# Patient Record
Sex: Female | Born: 1970 | Race: White | Hispanic: No | Marital: Married | Smoking: Never smoker
Health system: Southern US, Community
[De-identification: ages and names within clinical notes are randomized; demographics above are authoritative.]

## PROBLEM LIST (undated history)

## (undated) DIAGNOSIS — S7221XA Displaced subtrochanteric fracture of right femur, initial encounter for closed fracture: Secondary | ICD-10-CM

## (undated) DIAGNOSIS — K219 Gastro-esophageal reflux disease without esophagitis: Secondary | ICD-10-CM

## (undated) DIAGNOSIS — M810 Age-related osteoporosis without current pathological fracture: Secondary | ICD-10-CM

## (undated) DIAGNOSIS — Z5189 Encounter for other specified aftercare: Secondary | ICD-10-CM

## (undated) DIAGNOSIS — F32A Depression, unspecified: Secondary | ICD-10-CM

## (undated) DIAGNOSIS — H409 Unspecified glaucoma: Secondary | ICD-10-CM

## (undated) DIAGNOSIS — Z8632 Personal history of gestational diabetes: Secondary | ICD-10-CM

## (undated) DIAGNOSIS — E785 Hyperlipidemia, unspecified: Secondary | ICD-10-CM

## (undated) DIAGNOSIS — M8580 Other specified disorders of bone density and structure, unspecified site: Secondary | ICD-10-CM

## (undated) DIAGNOSIS — F329 Major depressive disorder, single episode, unspecified: Secondary | ICD-10-CM

## (undated) DIAGNOSIS — Z8669 Personal history of other diseases of the nervous system and sense organs: Secondary | ICD-10-CM

## (undated) DIAGNOSIS — S72001A Fracture of unspecified part of neck of right femur, initial encounter for closed fracture: Secondary | ICD-10-CM

## (undated) HISTORY — DX: Age-related osteoporosis without current pathological fracture: M81.0

## (undated) HISTORY — DX: Gastro-esophageal reflux disease without esophagitis: K21.9

## (undated) HISTORY — DX: Other specified disorders of bone density and structure, unspecified site: M85.80

## (undated) HISTORY — DX: Hyperlipidemia, unspecified: E78.5

## (undated) HISTORY — DX: Depression, unspecified: F32.A

## (undated) HISTORY — DX: Encounter for other specified aftercare: Z51.89

## (undated) HISTORY — DX: Personal history of other diseases of the nervous system and sense organs: Z86.69

## (undated) HISTORY — DX: Personal history of gestational diabetes: Z86.32

## (undated) HISTORY — DX: Unspecified glaucoma: H40.9

## (undated) HISTORY — PX: FRACTURE SURGERY: SHX138

## (undated) HISTORY — DX: Major depressive disorder, single episode, unspecified: F32.9

---
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
## 1998-02-09 ENCOUNTER — Other Ambulatory Visit: Admission: RE | Admit: 1998-02-09 | Discharge: 1998-02-09 | Payer: Self-pay | Admitting: Gynecology

## 1999-02-16 ENCOUNTER — Other Ambulatory Visit: Admission: RE | Admit: 1999-02-16 | Discharge: 1999-02-16 | Payer: Self-pay | Admitting: Gynecology

## 2000-02-22 ENCOUNTER — Other Ambulatory Visit: Admission: RE | Admit: 2000-02-22 | Discharge: 2000-02-22 | Payer: Self-pay | Admitting: *Deleted

## 2003-04-04 ENCOUNTER — Other Ambulatory Visit: Admission: RE | Admit: 2003-04-04 | Discharge: 2003-04-04 | Payer: Self-pay | Admitting: Gynecology

## 2004-06-25 ENCOUNTER — Other Ambulatory Visit: Admission: RE | Admit: 2004-06-25 | Discharge: 2004-06-25 | Payer: Self-pay | Admitting: Gynecology

## 2005-05-16 HISTORY — PX: RHINOPLASTY: SUR1284

## 2005-06-16 ENCOUNTER — Encounter: Payer: Self-pay | Admitting: Family Medicine

## 2005-06-28 ENCOUNTER — Other Ambulatory Visit: Admission: RE | Admit: 2005-06-28 | Discharge: 2005-06-28 | Payer: Self-pay | Admitting: Gynecology

## 2006-03-01 ENCOUNTER — Ambulatory Visit: Payer: Self-pay | Admitting: Family Medicine

## 2006-07-05 ENCOUNTER — Other Ambulatory Visit: Admission: RE | Admit: 2006-07-05 | Discharge: 2006-07-05 | Payer: Self-pay | Admitting: Gynecology

## 2007-07-13 ENCOUNTER — Other Ambulatory Visit: Admission: RE | Admit: 2007-07-13 | Discharge: 2007-07-13 | Payer: Self-pay | Admitting: Gynecology

## 2007-07-20 ENCOUNTER — Encounter: Admission: RE | Admit: 2007-07-20 | Discharge: 2007-07-20 | Payer: Self-pay | Admitting: Gynecology

## 2007-12-21 ENCOUNTER — Ambulatory Visit: Payer: Self-pay | Admitting: Family Medicine

## 2007-12-21 DIAGNOSIS — H6690 Otitis media, unspecified, unspecified ear: Secondary | ICD-10-CM | POA: Insufficient documentation

## 2007-12-21 DIAGNOSIS — H8110 Benign paroxysmal vertigo, unspecified ear: Secondary | ICD-10-CM

## 2007-12-26 ENCOUNTER — Telehealth (INDEPENDENT_AMBULATORY_CARE_PROVIDER_SITE_OTHER): Payer: Self-pay | Admitting: *Deleted

## 2008-01-07 ENCOUNTER — Telehealth: Payer: Self-pay | Admitting: Family Medicine

## 2008-01-07 DIAGNOSIS — H9190 Unspecified hearing loss, unspecified ear: Secondary | ICD-10-CM | POA: Insufficient documentation

## 2008-01-08 ENCOUNTER — Encounter: Payer: Self-pay | Admitting: Family Medicine

## 2008-07-15 ENCOUNTER — Ambulatory Visit: Payer: Self-pay | Admitting: Women's Health

## 2008-07-18 ENCOUNTER — Other Ambulatory Visit: Admission: RE | Admit: 2008-07-18 | Discharge: 2008-07-18 | Payer: Self-pay | Admitting: Gynecology

## 2008-07-18 ENCOUNTER — Ambulatory Visit: Payer: Self-pay | Admitting: Women's Health

## 2008-07-18 ENCOUNTER — Encounter: Payer: Self-pay | Admitting: Women's Health

## 2009-07-23 ENCOUNTER — Ambulatory Visit: Payer: Self-pay | Admitting: Women's Health

## 2009-07-24 ENCOUNTER — Other Ambulatory Visit: Admission: RE | Admit: 2009-07-24 | Discharge: 2009-07-24 | Payer: Self-pay | Admitting: Gynecology

## 2009-07-24 ENCOUNTER — Ambulatory Visit: Payer: Self-pay | Admitting: Women's Health

## 2010-02-19 ENCOUNTER — Encounter: Admission: RE | Admit: 2010-02-19 | Discharge: 2010-02-19 | Payer: Self-pay | Admitting: Gynecology

## 2010-03-17 ENCOUNTER — Ambulatory Visit: Payer: Self-pay | Admitting: Family Medicine

## 2010-06-17 NOTE — Assessment & Plan Note (Signed)
Summary: FLU SHOT/CLE  Nurse Visit   Allergies: No Known Drug Allergies  Orders Added: 1)  Admin 1st Vaccine [90471] 2)  Flu Vaccine 3yrs + [90658]   Flu Vaccine Consent Questions     Do you have a history of severe allergic reactions to this vaccine? no    Any prior history of allergic reactions to egg and/or gelatin? no    Do you have a sensitivity to the preservative Thimersol? no    Do you have a past history of Guillan-Barre Syndrome? no    Do you currently have an acute febrile illness? no    Have you ever had a severe reaction to latex? no    Vaccine information given and explained to patient? yes    Are you currently pregnant? no    Lot Number:[id number]   Exp Date:11/13/2010   Site Given  Left Deltoid IM 

## 2010-09-28 ENCOUNTER — Other Ambulatory Visit (INDEPENDENT_AMBULATORY_CARE_PROVIDER_SITE_OTHER): Payer: Managed Care, Other (non HMO)

## 2010-09-28 DIAGNOSIS — Z01419 Encounter for gynecological examination (general) (routine) without abnormal findings: Secondary | ICD-10-CM

## 2010-09-28 DIAGNOSIS — R82998 Other abnormal findings in urine: Secondary | ICD-10-CM

## 2010-09-28 DIAGNOSIS — Z13220 Encounter for screening for lipoid disorders: Secondary | ICD-10-CM

## 2010-09-28 DIAGNOSIS — Z833 Family history of diabetes mellitus: Secondary | ICD-10-CM

## 2010-09-29 ENCOUNTER — Other Ambulatory Visit: Payer: Self-pay | Admitting: Women's Health

## 2010-09-29 ENCOUNTER — Encounter (INDEPENDENT_AMBULATORY_CARE_PROVIDER_SITE_OTHER): Payer: Managed Care, Other (non HMO) | Admitting: Women's Health

## 2010-09-29 ENCOUNTER — Other Ambulatory Visit (HOSPITAL_COMMUNITY)
Admission: RE | Admit: 2010-09-29 | Discharge: 2010-09-29 | Disposition: A | Discharge status: Home / Self Care | Payer: Managed Care, Other (non HMO) | Source: Ambulatory Visit | Attending: Pediatrics | Admitting: Pediatrics

## 2010-09-29 DIAGNOSIS — Z01419 Encounter for gynecological examination (general) (routine) without abnormal findings: Secondary | ICD-10-CM

## 2011-01-25 ENCOUNTER — Other Ambulatory Visit: Payer: Self-pay | Admitting: Gynecology

## 2011-01-25 DIAGNOSIS — Z1231 Encounter for screening mammogram for malignant neoplasm of breast: Secondary | ICD-10-CM

## 2011-02-25 ENCOUNTER — Ambulatory Visit
Admission: RE | Admit: 2011-02-25 | Discharge: 2011-02-25 | Disposition: A | Discharge status: Home / Self Care | Payer: Managed Care, Other (non HMO) | Source: Ambulatory Visit | Attending: Gynecology | Admitting: Gynecology

## 2011-02-25 DIAGNOSIS — Z1231 Encounter for screening mammogram for malignant neoplasm of breast: Secondary | ICD-10-CM

## 2011-03-09 ENCOUNTER — Ambulatory Visit: Payer: Managed Care, Other (non HMO)

## 2011-11-04 ENCOUNTER — Telehealth: Payer: Self-pay | Admitting: *Deleted

## 2011-11-04 DIAGNOSIS — Z0000 Encounter for general adult medical examination without abnormal findings: Secondary | ICD-10-CM

## 2011-11-04 DIAGNOSIS — Z139 Encounter for screening, unspecified: Secondary | ICD-10-CM

## 2011-11-04 NOTE — Telephone Encounter (Signed)
Pt has annual scheduled on June 28, she would like to have labs done prior to annual. Okay to place?

## 2011-11-04 NOTE — Telephone Encounter (Signed)
Order placed, verbal order for TSH, pt informed with this as well.

## 2011-11-04 NOTE — Telephone Encounter (Signed)
Ok please have her come fasting for lipid panel, glucose, CBC and UA.

## 2011-11-08 ENCOUNTER — Other Ambulatory Visit: Payer: Managed Care, Other (non HMO)

## 2011-11-08 DIAGNOSIS — Z0000 Encounter for general adult medical examination without abnormal findings: Secondary | ICD-10-CM

## 2011-11-08 LAB — LIPID PANEL
Cholesterol: 195 mg/dL (ref 0–200)
Total CHOL/HDL Ratio: 4.6 Ratio
VLDL: 29 mg/dL (ref 0–40)

## 2011-11-08 LAB — CBC
MCH: 29.8 pg (ref 26.0–34.0)
Platelets: 350 10*3/uL (ref 150–400)
RBC: 4.39 MIL/uL (ref 3.87–5.11)
RDW: 13.5 % (ref 11.5–15.5)

## 2011-11-08 LAB — GLUCOSE, RANDOM: Glucose, Bld: 82 mg/dL (ref 70–99)

## 2011-11-08 LAB — TSH: TSH: 2.813 u[IU]/mL (ref 0.350–4.500)

## 2011-11-09 LAB — URINALYSIS W MICROSCOPIC + REFLEX CULTURE
Bacteria, UA: NONE SEEN
Crystals: NONE SEEN
Hgb urine dipstick: NEGATIVE
Ketones, ur: NEGATIVE mg/dL
Nitrite: NEGATIVE
Protein, ur: NEGATIVE mg/dL
Specific Gravity, Urine: 1.022 (ref 1.005–1.030)
Urobilinogen, UA: 0.2 mg/dL (ref 0.0–1.0)

## 2011-11-11 ENCOUNTER — Ambulatory Visit (INDEPENDENT_AMBULATORY_CARE_PROVIDER_SITE_OTHER): Payer: Managed Care, Other (non HMO) | Admitting: Women's Health

## 2011-11-11 ENCOUNTER — Encounter: Payer: Self-pay | Admitting: Women's Health

## 2011-11-11 VITALS — BP 110/60 | Ht 63.0 in | Wt 114.0 lb

## 2011-11-11 DIAGNOSIS — Z01419 Encounter for gynecological examination (general) (routine) without abnormal findings: Secondary | ICD-10-CM

## 2011-11-11 DIAGNOSIS — IMO0001 Reserved for inherently not codable concepts without codable children: Secondary | ICD-10-CM

## 2011-11-11 MED ORDER — ETONOGESTREL-ETHINYL ESTRADIOL 0.12-0.015 MG/24HR VA RING: VAGINAL_RING | VAGINAL | Status: DC

## 2011-11-11 NOTE — Patient Instructions (Addendum)

## 2011-11-11 NOTE — Progress Notes (Signed)
Nichole Mcmahon 1970/11/05 [id number]    History:    The patient presents for annual exam.  Monthly one day cycle with nuva ring with no complaints. History of normal mammograms and Paps.   Past medical history, past surgical history, family history and social history were all reviewed and documented in the EPIC chart. Works at a [employer] manager. History of GDM. Son Nichole Mcmahon 16 doing well. History of depression, resolved denies any problems   ROS:  A  ROS was performed and pertinent positives and negatives are included in the history.  Exam:  Filed Vitals:   11/11/11 0830  BP: 110/60    General appearance:  Normal Head/Neck:  Normal, without cervical or supraclavicular adenopathy. Thyroid:  Symmetrical, normal in size, without palpable masses or nodularity. Respiratory  Effort:  Normal  Auscultation:  Clear without wheezing or rhonchi Cardiovascular  Auscultation:  Regular rate, without rubs, murmurs or gallops  Edema/varicosities:  Not grossly evident Abdominal  Soft,nontender, without masses, guarding or rebound.  Liver/spleen:  No organomegaly noted  Hernia:  None appreciated  Skin  Inspection:  Grossly normal  Palpation:  Grossly normal Neurologic/psychiatric  Orientation:  Normal with appropriate conversation.  Mood/affect:  Normal  Genitourinary    Breasts: Examined lying and sitting.     Right: Without masses, retractions, discharge or axillary adenopathy.     Left: Without masses, retractions, discharge or axillary adenopathy.   Inguinal/mons:  Normal without inguinal adenopathy  External genitalia:  Normal  BUS/Urethra/Skene's glands:  Normal  Bladder:  Normal  Vagina:  Normal  Cervix:  Normal  Uterus:   normal in size, shape and contour.  Midline and mobile  Adnexa/parametria:     Rt: Without masses or tenderness.   Lt: Without masses or tenderness.  Anus and perineum: Normal  Digital rectal exam: Normal sphincter tone without palpated masses or  tenderness  Assessment/Plan:  41 y.o.  M. WF G1 P1 for annual exam with no complaints.  Normal GYN exam on NuvaRing History of GDM  Plan: Labs reviewed had drawn prior to office visit. Reviewed glucose, CBC, TSH normal. Lipid panel okay, LDL slightly elevated at 124 and triglycerides 144.  Reviewed importance of increasing fiber rich foods, decreasing saturated fat, fish oil supplement daily, continue exercise and healthy lifestyle. SBE's, annual mammogram, vitamin D 1000 daily. No Pap, history of all normal Paps, new screening guidelines reviewed.    Harrington Challenger [facility], 11:52 AM 11/11/2011

## 2012-02-28 ENCOUNTER — Other Ambulatory Visit: Payer: Self-pay | Admitting: Gynecology

## 2012-02-28 DIAGNOSIS — Z1231 Encounter for screening mammogram for malignant neoplasm of breast: Secondary | ICD-10-CM

## 2012-03-09 ENCOUNTER — Ambulatory Visit
Admission: RE | Admit: 2012-03-09 | Discharge: 2012-03-09 | Disposition: A | Discharge status: Home / Self Care | Payer: Commercial Indemnity | Source: Ambulatory Visit | Attending: Gynecology | Admitting: Gynecology

## 2012-03-09 DIAGNOSIS — Z1231 Encounter for screening mammogram for malignant neoplasm of breast: Secondary | ICD-10-CM

## 2012-10-29 ENCOUNTER — Telehealth: Payer: Self-pay | Admitting: *Deleted

## 2012-10-29 DIAGNOSIS — Z01419 Encounter for gynecological examination (general) (routine) without abnormal findings: Secondary | ICD-10-CM

## 2012-10-29 NOTE — Telephone Encounter (Signed)
(  pt aware you are out of the office) Pt has annual scheduled on 11/23/12 @ 8:30 am, she would like to come prior to appointment to have labs drawn. Let me know what labs pt will need and I will place orders and informed pt. Please advise

## 2012-10-30 NOTE — Telephone Encounter (Signed)
Orders placed, left message on voicemail okay to schedule prior to appointment.

## 2012-10-30 NOTE — Telephone Encounter (Signed)
Earlisha will need CBC,Glu, LP, thanks

## 2012-11-15 ENCOUNTER — Other Ambulatory Visit: Payer: Managed Care, Other (non HMO)

## 2012-11-15 DIAGNOSIS — Z01419 Encounter for gynecological examination (general) (routine) without abnormal findings: Secondary | ICD-10-CM

## 2012-11-15 LAB — GLUCOSE, RANDOM: Glucose, Bld: 84 mg/dL (ref 70–99)

## 2012-11-15 LAB — CBC
Hemoglobin: 13 g/dL (ref 12.0–15.0)
Platelets: 332 10*3/uL (ref 150–400)
RBC: 4.26 MIL/uL (ref 3.87–5.11)
WBC: 8.1 10*3/uL (ref 4.0–10.5)

## 2012-11-15 LAB — LIPID PANEL
Cholesterol: 173 mg/dL (ref 0–200)
HDL: 45 mg/dL (ref >39)
Total CHOL/HDL Ratio: 3.8 Ratio
Triglycerides: 146 mg/dL (ref <150)

## 2012-11-23 ENCOUNTER — Ambulatory Visit (INDEPENDENT_AMBULATORY_CARE_PROVIDER_SITE_OTHER): Payer: Managed Care, Other (non HMO) | Admitting: Women's Health

## 2012-11-23 ENCOUNTER — Other Ambulatory Visit (HOSPITAL_COMMUNITY)
Admission: RE | Admit: 2012-11-23 | Discharge: 2012-11-23 | Disposition: A | Discharge status: Home / Self Care | Payer: Managed Care, Other (non HMO) | Source: Ambulatory Visit | Attending: Gynecology | Admitting: Gynecology

## 2012-11-23 ENCOUNTER — Encounter: Payer: Self-pay | Admitting: Women's Health

## 2012-11-23 VITALS — BP 102/62 | Ht 63.0 in | Wt 116.0 lb

## 2012-11-23 DIAGNOSIS — Z309 Encounter for contraceptive management, unspecified: Secondary | ICD-10-CM

## 2012-11-23 DIAGNOSIS — IMO0001 Reserved for inherently not codable concepts without codable children: Secondary | ICD-10-CM

## 2012-11-23 DIAGNOSIS — Z01419 Encounter for gynecological examination (general) (routine) without abnormal findings: Secondary | ICD-10-CM

## 2012-11-23 MED ORDER — ETONOGESTREL-ETHINYL ESTRADIOL 0.12-0.015 MG/24HR VA RING: VAGINAL_RING | VAGINAL | Status: DC

## 2012-11-23 NOTE — Progress Notes (Signed)
Nichole Mcmahon 08-06-1970 [id number]    History:    The patient presents for annual exam.  Monthly cycle on NuvaRing without complaint. History of normal Paps and mammograms. Healthy lifestyle. History of GDM. Have labs drawn last week, normal CBC, glucose, lipid panel.   Past medical history, past surgical history, family history and social history were all reviewed and documented in the EPIC chart. 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 Mother numerous health problems including hypertension, diabetes and has had a kidney transplant.   ROS:  A  ROS was performed and pertinent positives and negatives are included in the history.  Exam:  Filed Vitals:   11/23/12 0828  BP: 102/62    General appearance:  Normal Head/Neck:  Normal, without cervical or supraclavicular adenopathy. Thyroid:  Symmetrical, normal in size, without palpable masses or nodularity. Respiratory  Effort:  Normal  Auscultation:  Clear without wheezing or rhonchi Cardiovascular  Auscultation:  Regular rate, without rubs, murmurs or gallops  Edema/varicosities:  Not grossly evident Abdominal  Soft,nontender, without masses, guarding or rebound.  Liver/spleen:  No organomegaly noted  Hernia:  None appreciated  Skin  Inspection:  Grossly normal  Palpation:  Grossly normal Neurologic/psychiatric  Orientation:  Normal with appropriate conversation.  Mood/affect:  Normal  Genitourinary    Breasts: Examined lying and sitting.     Right: Without masses, retractions, discharge or axillary adenopathy.     Left: Without masses, retractions, discharge or axillary adenopathy.   Inguinal/mons:  Normal without inguinal adenopathy  External genitalia:  Normal  BUS/Urethra/Skene's glands:  Normal  Bladder:  Normal  Vagina:  Normal  Cervix:  Normal  Uterus:   normal in size, shape and contour.  Midline and mobile  Adnexa/parametria:     Rt: Without masses or  tenderness.   Lt: Without masses or tenderness.  Anus and perineum: Normal  Digital rectal exam: Normal sphincter tone without palpated masses or tenderness  Assessment/Plan:  42 y.o. M. WF G1 P1 for annual exam with no complaints.  Normal GYN exam on NuvaRing  Plan: NuvaRing prescription, proper use, slight risk for blood clots and strokes reviewed. SBE's, continue annual mammogram, calcium rich diet, vitamin D 1000 daily encouraged. Pap. Pap normal 2012, new screening guidelines reviewed.   Harrington Challenger [facility], 9:00 AM 11/23/2012

## 2012-11-23 NOTE — Patient Instructions (Addendum)

## 2012-11-23 NOTE — Addendum Note (Signed)
Addended by: Richardson Chiquito on: 11/23/2012 11:56 AM   Modules accepted: Orders

## 2012-12-14 ENCOUNTER — Encounter: Payer: Self-pay | Admitting: Women's Health

## 2012-12-14 ENCOUNTER — Other Ambulatory Visit (HOSPITAL_COMMUNITY)
Admission: RE | Admit: 2012-12-14 | Discharge: 2012-12-14 | Disposition: A | Discharge status: Home / Self Care | Payer: Managed Care, Other (non HMO) | Source: Ambulatory Visit | Attending: Gynecology | Admitting: Gynecology

## 2012-12-14 ENCOUNTER — Ambulatory Visit (INDEPENDENT_AMBULATORY_CARE_PROVIDER_SITE_OTHER): Payer: Managed Care, Other (non HMO) | Admitting: Women's Health

## 2012-12-14 DIAGNOSIS — Z01419 Encounter for gynecological examination (general) (routine) without abnormal findings: Secondary | ICD-10-CM | POA: Insufficient documentation

## 2012-12-14 DIAGNOSIS — R87616 Satisfactory cervical smear but lacking transformation zone: Secondary | ICD-10-CM

## 2012-12-14 NOTE — Progress Notes (Signed)
Patient ID: Nichole Mcmahon, female   DOB: [birth year], 42 y.o.   MRN: [id number] Present for Pap, pap at annual exam acellular. Without complaint. History of normal Paps.  Exam: Appears well, external genitalia within normal limits, speculum exam scant discharge, repeat Pap taken.   Plan: Triage based on Pap results.

## 2013-02-08 ENCOUNTER — Other Ambulatory Visit: Payer: Self-pay

## 2013-02-08 DIAGNOSIS — Z1231 Encounter for screening mammogram for malignant neoplasm of breast: Secondary | ICD-10-CM

## 2013-03-15 ENCOUNTER — Ambulatory Visit
Admission: RE | Admit: 2013-03-15 | Discharge: 2013-03-15 | Disposition: A | Discharge status: Home / Self Care | Payer: Private Health Insurance - Indemnity | Source: Ambulatory Visit

## 2013-03-15 DIAGNOSIS — Z1231 Encounter for screening mammogram for malignant neoplasm of breast: Secondary | ICD-10-CM

## 2013-03-19 ENCOUNTER — Other Ambulatory Visit: Payer: Self-pay | Admitting: Gynecology

## 2013-03-19 DIAGNOSIS — R928 Other abnormal and inconclusive findings on diagnostic imaging of breast: Secondary | ICD-10-CM

## 2013-03-21 ENCOUNTER — Other Ambulatory Visit: Payer: Self-pay

## 2013-04-05 ENCOUNTER — Ambulatory Visit
Admission: RE | Admit: 2013-04-05 | Discharge: 2013-04-05 | Disposition: A | Discharge status: Home / Self Care | Payer: Self-pay | Source: Ambulatory Visit | Attending: Gynecology | Admitting: Gynecology

## 2013-04-05 DIAGNOSIS — R928 Other abnormal and inconclusive findings on diagnostic imaging of breast: Secondary | ICD-10-CM

## 2013-04-29 ENCOUNTER — Telehealth: Payer: Self-pay | Admitting: *Deleted

## 2013-04-29 DIAGNOSIS — IMO0001 Reserved for inherently not codable concepts without codable children: Secondary | ICD-10-CM

## 2013-04-29 MED ORDER — ETONOGESTREL-ETHINYL ESTRADIOL 0.12-0.015 MG/24HR VA RING: VAGINAL_RING | VAGINAL | Status: DC

## 2013-04-29 NOTE — Telephone Encounter (Signed)
Pt called requesting nuvaring sent to [facility] 3 month supply will be sent.

## 2013-05-01 MED ORDER — ETONOGESTREL-ETHINYL ESTRADIOL 0.12-0.015 MG/24HR VA RING: VAGINAL_RING | VAGINAL | Status: DC

## 2013-05-01 NOTE — Addendum Note (Signed)
Addended by: Aura Camps on: 05/01/2013 11:40 AM   Modules accepted: Orders

## 2013-10-01 ENCOUNTER — Telehealth: Payer: Self-pay | Admitting: *Deleted

## 2013-10-01 DIAGNOSIS — Z01419 Encounter for gynecological examination (general) (routine) without abnormal findings: Secondary | ICD-10-CM

## 2013-10-01 NOTE — Telephone Encounter (Signed)
Pt informed, she will be on 11/26/13 @ 9:00 am for labs.

## 2013-10-01 NOTE — Telephone Encounter (Signed)
Pt has annual scheduled on 11/29/13, would like labs done prior to appointment. Please advise

## 2013-10-01 NOTE — Telephone Encounter (Signed)
Will need a CBC, comprehensive metabolic, lipid panel and TSH.

## 2013-11-26 ENCOUNTER — Other Ambulatory Visit: Payer: Managed Care, Other (non HMO)

## 2013-11-26 DIAGNOSIS — Z01419 Encounter for gynecological examination (general) (routine) without abnormal findings: Secondary | ICD-10-CM

## 2013-11-26 LAB — CBC WITH DIFFERENTIAL/PLATELET
BASOS ABS: 0.1 10*3/uL (ref 0.0–0.1)
Basophils Relative: 1 % (ref 0–1)
Eosinophils Absolute: 0.1 10*3/uL (ref 0.0–0.7)
Eosinophils Relative: 1 % (ref 0–5)
HEMATOCRIT: 37.3 % (ref 36.0–46.0)
Hemoglobin: 13 g/dL (ref 12.0–15.0)
LYMPHS PCT: 30 % (ref 12–46)
Lymphs Abs: 2.5 10*3/uL (ref 0.7–4.0)
MCH: 30.6 pg (ref 26.0–34.0)
MCHC: 34.9 g/dL (ref 30.0–36.0)
MCV: 87.8 fL (ref 78.0–100.0)
MONO ABS: 0.4 10*3/uL (ref 0.1–1.0)
Monocytes Relative: 5 % (ref 3–12)
NEUTROS ABS: 5.3 10*3/uL (ref 1.7–7.7)
Neutrophils Relative %: 63 % (ref 43–77)
PLATELETS: 361 10*3/uL (ref 150–400)
RBC: 4.25 MIL/uL (ref 3.87–5.11)
RDW: 13.4 % (ref 11.5–15.5)
WBC: 8.4 10*3/uL (ref 4.0–10.5)

## 2013-11-26 LAB — LIPID PANEL
Cholesterol: 165 mg/dL (ref 0–200)
HDL: 48 mg/dL (ref >39)
LDL Cholesterol: 84 mg/dL (ref 0–99)
Total CHOL/HDL Ratio: 3.4 Ratio
Triglycerides: 167 mg/dL — ABNORMAL HIGH (ref <150)
VLDL: 33 mg/dL (ref 0–40)

## 2013-11-26 LAB — COMPREHENSIVE METABOLIC PANEL
ALBUMIN: 4.1 g/dL (ref 3.5–5.2)
ALT: 9 U/L (ref 0–35)
AST: 8 U/L (ref 0–37)
Alkaline Phosphatase: 41 U/L (ref 39–117)
BUN: 11 mg/dL (ref 6–23)
CALCIUM: 9.3 mg/dL (ref 8.4–10.5)
CHLORIDE: 105 meq/L (ref 96–112)
CO2: 27 mEq/L (ref 19–32)
Creat: 0.68 mg/dL (ref 0.50–1.10)
Glucose, Bld: 81 mg/dL (ref 70–99)
Potassium: 4.3 mEq/L (ref 3.5–5.3)
Sodium: 139 mEq/L (ref 135–145)
Total Bilirubin: 0.4 mg/dL (ref 0.2–1.2)
Total Protein: 6.6 g/dL (ref 6.0–8.3)

## 2013-11-26 LAB — TSH: TSH: 2.784 u[IU]/mL (ref 0.350–4.500)

## 2013-11-27 ENCOUNTER — Encounter: Payer: Self-pay | Admitting: Women's Health

## 2013-11-29 ENCOUNTER — Encounter: Payer: Self-pay | Admitting: Women's Health

## 2013-11-29 ENCOUNTER — Ambulatory Visit (INDEPENDENT_AMBULATORY_CARE_PROVIDER_SITE_OTHER): Payer: Managed Care, Other (non HMO) | Admitting: Women's Health

## 2013-11-29 VITALS — BP 116/74 | Ht 63.0 in | Wt 110.0 lb

## 2013-11-29 DIAGNOSIS — Z3040 Encounter for surveillance of contraceptives, unspecified: Secondary | ICD-10-CM

## 2013-11-29 DIAGNOSIS — Z01419 Encounter for gynecological examination (general) (routine) without abnormal findings: Secondary | ICD-10-CM

## 2013-11-29 MED ORDER — ETONOGESTREL-ETHINYL ESTRADIOL 0.12-0.015 MG/24HR VA RING: VAGINAL_RING | VAGINAL | Status: DC

## 2013-11-29 NOTE — Progress Notes (Signed)
Nichole Mcmahon [birth year] [id number]    History:    Presents for annual exam.  Regular monthly cycle on nuva ring without complaint. Normal Pap and mammogram history. History of gestational diabetes.  Past medical history, past surgical history, family history and social history were all reviewed and documented in the EPIC chart. 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 Mother diabetes, hypertension, history of a kidney transplant.  ROS:  A  12 point ROS was performed and pertinent positives and negatives are included.  Exam:  Filed Vitals:   [birth year] 0820  BP: 116/74    General appearance:  Normal Thyroid:  Symmetrical, normal in size, without palpable masses or nodularity. Respiratory  Auscultation:  Clear without wheezing or rhonchi Cardiovascular  Auscultation:  Regular rate, without rubs, murmurs or gallops  Edema/varicosities:  Not grossly evident Abdominal  Soft,nontender, without masses, guarding or rebound.  Liver/spleen:  No organomegaly noted  Hernia:  None appreciated  Skin  Inspection:  Grossly normal   Breasts: Examined lying and sitting.     Right: Without masses, retractions, discharge or axillary adenopathy.     Left: Without masses, retractions, discharge or axillary adenopathy. Gentitourinary   Inguinal/mons:  Normal without inguinal adenopathy  External genitalia:  Normal  BUS/Urethra/Skene's glands:  Normal  Vagina:  Normal  Cervix:  Normal  Uterus:  normal in size, shape and contour.  Midline and mobile  Adnexa/parametria:     Rt: Without masses or tenderness.   Lt: Without masses or tenderness.  Anus and perineum: Normal  Digital rectal exam: Normal sphincter tone without palpated masses or tenderness  Assessment/Plan:  43 y.o.MWF G1P1  for annual exam with no complaints.  Normal GYN exam on NuvaRing Left ear hearing loss  Plan: NuvaRing prescription, proper use, slight risk for blood clots and strokes reviewed. SBE's,  continue annual mammogram, treating tomography reviewed history of benign breast cysts. 2014 mammogram  normal after additional ultrasound pictures. Regular exercise, calcium rich diet, vitamin D 1000 daily encouraged. Reviewed labs that were done this week, normal CBC, glucose, TSH. Lipid panel, triglycerides slightly elevated will add a fish oil supplement to diet, and decreased saturated fat to less than 20 g daily. Pap normal 2014, new screening guidelines reviewed.  Note: This dictation was prepared with Dragon/digital dictation.  Any transcriptional errors that result are unintentional. [name] [facility], 9:03 AM 11/29/2013

## 2013-11-29 NOTE — Patient Instructions (Signed)

## 2013-11-30 LAB — URINALYSIS W MICROSCOPIC + REFLEX CULTURE
Bilirubin Urine: NEGATIVE
CASTS: NONE SEEN
CRYSTALS: NONE SEEN
GLUCOSE, UA: NEGATIVE mg/dL
Hgb urine dipstick: NEGATIVE
Ketones, ur: NEGATIVE mg/dL
NITRITE: NEGATIVE
Protein, ur: NEGATIVE mg/dL
Urobilinogen, UA: 0.2 mg/dL (ref 0.0–1.0)
pH: 6 (ref 5.0–8.0)

## 2013-12-01 LAB — URINE CULTURE: Colony Count: 85000

## 2013-12-18 ENCOUNTER — Telehealth: Payer: Self-pay

## 2013-12-18 NOTE — Telephone Encounter (Signed)
Pt has a form from work requesting pt to have tetanus vaccine, hep B vaccine and TB skin test.  should she have a hep B titer; pt is not sure if had hep b vaccines; pt does not think she has had vaccines since kindergarten. Pt last seen by Dr Glori Bickers 12/21/2007. Does pt need to schedule appt?

## 2013-12-18 NOTE — Telephone Encounter (Signed)
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

## 2013-12-19 NOTE — Telephone Encounter (Signed)
Pt notified of Dr. Marliss Coots comments and nurse appt scheduled to get tdap, Hep B and PPD placed

## 2013-12-20 ENCOUNTER — Ambulatory Visit: Payer: Private Health Insurance - Indemnity

## 2014-01-03 ENCOUNTER — Ambulatory Visit (INDEPENDENT_AMBULATORY_CARE_PROVIDER_SITE_OTHER): Payer: Managed Care, Other (non HMO)

## 2014-01-03 DIAGNOSIS — Z111 Encounter for screening for respiratory tuberculosis: Secondary | ICD-10-CM

## 2014-01-03 DIAGNOSIS — Z23 Encounter for immunization: Secondary | ICD-10-CM

## 2014-01-06 LAB — TB SKIN TEST: TB SKIN TEST: NEGATIVE

## 2014-03-03 ENCOUNTER — Other Ambulatory Visit: Payer: Self-pay

## 2014-03-03 DIAGNOSIS — Z1231 Encounter for screening mammogram for malignant neoplasm of breast: Secondary | ICD-10-CM

## 2014-03-07 ENCOUNTER — Ambulatory Visit (INDEPENDENT_AMBULATORY_CARE_PROVIDER_SITE_OTHER): Payer: BC Managed Care – PPO

## 2014-03-07 DIAGNOSIS — Z23 Encounter for immunization: Secondary | ICD-10-CM

## 2014-03-17 ENCOUNTER — Encounter: Payer: Self-pay | Admitting: Women's Health

## 2014-03-21 ENCOUNTER — Ambulatory Visit
Admission: RE | Admit: 2014-03-21 | Discharge: 2014-03-21 | Disposition: A | Discharge status: Home / Self Care | Payer: BC Managed Care – PPO | Source: Ambulatory Visit

## 2014-03-21 DIAGNOSIS — Z1231 Encounter for screening mammogram for malignant neoplasm of breast: Secondary | ICD-10-CM

## 2014-04-14 ENCOUNTER — Encounter: Payer: Self-pay | Admitting: Gynecology

## 2014-04-14 ENCOUNTER — Ambulatory Visit (INDEPENDENT_AMBULATORY_CARE_PROVIDER_SITE_OTHER): Payer: BC Managed Care – PPO | Admitting: Gynecology

## 2014-04-14 DIAGNOSIS — R102 Pelvic and perineal pain: Secondary | ICD-10-CM

## 2014-04-14 LAB — URINALYSIS W MICROSCOPIC + REFLEX CULTURE
Bilirubin Urine: NEGATIVE
Glucose, UA: NEGATIVE mg/dL
Hgb urine dipstick: NEGATIVE
KETONES UR: NEGATIVE mg/dL
Leukocytes, UA: NEGATIVE
NITRITE: NEGATIVE
Protein, ur: NEGATIVE mg/dL
Specific Gravity, Urine: <1.005 — ABNORMAL LOW (ref 1.005–1.030)
UROBILINOGEN UA: 0.2 mg/dL (ref 0.0–1.0)
pH: 5 (ref 5.0–8.0)

## 2014-04-14 MED ORDER — SULFAMETHOXAZOLE-TRIMETHOPRIM 800-160 MG PO TABS: 1.0000 | ORAL_TABLET | Freq: Two times a day (BID) | ORAL | Status: DC

## 2014-04-14 NOTE — Progress Notes (Signed)
Nichole Mcmahon 04/26/1971 [id number]        43 y.o.  G1P1 with 3 days of suprapubic pressure, frequency and mild dysuria. No low back pain fever chills nausea vomiting constipation or diarrhea. History of one UTI in the past.  Past medical history,surgical history, problem list, medications, allergies, family history and social history were all reviewed and documented in the EPIC chart.  Directed ROS with pertinent positives and negatives documented in the history of present illness/assessment and plan.  Exam: Kim assistant General appearance:  Normal Spine straight without CVA tenderness Abdomen soft nontender without masses guarding rebound organomegaly. Pelvic external BUS vagina normal. Cervix normal. Uterus normal size and mobile nontender. Adnexa without masses or tenderness.  Assessment/Plan:  43 y.o. G1P1 with above symptoms. Exam is normal. UA is negative. Using NuvaRing for contraception with regular menses. Will treat with short course of antibiotics for early cystitis recognizing negative UA. She will call if her symptoms persist or worsen and will follow up with ultrasound for pelvic surveillance. Assuming her symptoms clear then we will follow expectantly.     Anastasio Auerbach MD, 10:39 AM 04/14/2014

## 2014-04-14 NOTE — Patient Instructions (Signed)
Take antibiotic twice daily for 3 days. Follow up if your symptoms persist or worsen.

## 2014-07-10 ENCOUNTER — Ambulatory Visit (INDEPENDENT_AMBULATORY_CARE_PROVIDER_SITE_OTHER): Payer: BLUE CROSS/BLUE SHIELD | Admitting: *Deleted

## 2014-07-10 DIAGNOSIS — Z23 Encounter for immunization: Secondary | ICD-10-CM

## 2014-12-19 ENCOUNTER — Other Ambulatory Visit (HOSPITAL_COMMUNITY)
Admission: RE | Admit: 2014-12-19 | Discharge: 2014-12-19 | Disposition: A | Discharge status: Home / Self Care | Payer: BLUE CROSS/BLUE SHIELD | Source: Ambulatory Visit | Attending: Women's Health | Admitting: Women's Health

## 2014-12-19 ENCOUNTER — Encounter: Payer: Self-pay | Admitting: Women's Health

## 2014-12-19 ENCOUNTER — Ambulatory Visit (INDEPENDENT_AMBULATORY_CARE_PROVIDER_SITE_OTHER): Payer: BLUE CROSS/BLUE SHIELD | Admitting: Women's Health

## 2014-12-19 VITALS — Ht 64.0 in | Wt 114.0 lb

## 2014-12-19 DIAGNOSIS — Z01419 Encounter for gynecological examination (general) (routine) without abnormal findings: Secondary | ICD-10-CM | POA: Diagnosis not present

## 2014-12-19 DIAGNOSIS — Z1151 Encounter for screening for human papillomavirus (HPV): Secondary | ICD-10-CM | POA: Diagnosis present

## 2014-12-19 DIAGNOSIS — Z3040 Encounter for surveillance of contraceptives, unspecified: Secondary | ICD-10-CM | POA: Diagnosis not present

## 2014-12-19 DIAGNOSIS — Z13220 Encounter for screening for lipoid disorders: Secondary | ICD-10-CM | POA: Diagnosis not present

## 2014-12-19 LAB — COMPREHENSIVE METABOLIC PANEL
ALBUMIN: 4 g/dL (ref 3.6–5.1)
ALT: 9 U/L (ref 6–29)
AST: 12 U/L (ref 10–30)
Alkaline Phosphatase: 35 U/L (ref 33–115)
BUN: 11 mg/dL (ref 7–25)
CO2: 26 mmol/L (ref 20–31)
Calcium: 9.3 mg/dL (ref 8.6–10.2)
Chloride: 103 mmol/L (ref 98–110)
Creat: 0.69 mg/dL (ref 0.50–1.10)
GLUCOSE: 82 mg/dL (ref 65–99)
Potassium: 4.1 mmol/L (ref 3.5–5.3)
Sodium: 138 mmol/L (ref 135–146)
Total Bilirubin: 0.4 mg/dL (ref 0.2–1.2)
Total Protein: 6.7 g/dL (ref 6.1–8.1)

## 2014-12-19 LAB — CBC WITH DIFFERENTIAL/PLATELET
Basophils Absolute: 0.1 10*3/uL (ref 0.0–0.1)
Basophils Relative: 1 % (ref 0–1)
EOS ABS: 0.1 10*3/uL (ref 0.0–0.7)
EOS PCT: 1 % (ref 0–5)
HCT: 39.3 % (ref 36.0–46.0)
Hemoglobin: 13.4 g/dL (ref 12.0–15.0)
LYMPHS ABS: 3 10*3/uL (ref 0.7–4.0)
Lymphocytes Relative: 36 % (ref 12–46)
MCH: 30.9 pg (ref 26.0–34.0)
MCHC: 34.1 g/dL (ref 30.0–36.0)
MCV: 90.8 fL (ref 78.0–100.0)
MPV: 9.9 fL (ref 8.6–12.4)
Monocytes Absolute: 0.7 10*3/uL (ref 0.1–1.0)
Monocytes Relative: 8 % (ref 3–12)
NEUTROS ABS: 4.5 10*3/uL (ref 1.7–7.7)
Neutrophils Relative %: 54 % (ref 43–77)
PLATELETS: 359 10*3/uL (ref 150–400)
RBC: 4.33 MIL/uL (ref 3.87–5.11)
RDW: 13 % (ref 11.5–15.5)
WBC: 8.4 10*3/uL (ref 4.0–10.5)

## 2014-12-19 LAB — LIPID PANEL
CHOLESTEROL: 168 mg/dL (ref 125–200)
HDL: 45 mg/dL — ABNORMAL LOW (ref >=46)
LDL Cholesterol: 93 mg/dL (ref <130)
Total CHOL/HDL Ratio: 3.7 Ratio (ref <=5.0)
Triglycerides: 148 mg/dL (ref <150)
VLDL: 30 mg/dL (ref <30)

## 2014-12-19 MED ORDER — ETONOGESTREL-ETHINYL ESTRADIOL 0.12-0.015 MG/24HR VA RING: VAGINAL_RING | VAGINAL | Status: DC

## 2014-12-19 NOTE — Patient Instructions (Signed)

## 2014-12-19 NOTE — Addendum Note (Signed)
Addended by: Thurnell Garbe A on: 12/19/2014 10:12 AM   Modules accepted: Orders, SmartSet

## 2014-12-19 NOTE — Progress Notes (Signed)
Nichole Mcmahon [birth year] [id number]    History:    Presents for annual exam.  Amenorrheic on NuvaRing continuously. Normal Pap and mammogram history. History of GDM. Left ear hearing loss. Works diligently with exercise and healthy diet to prevent illness, mother with numerous health problems diabetes, hypertension and kidney transplant.  Past medical history, past surgical history, family history and social history were all reviewed and documented in the EPIC chart. 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  ROS:  A ROS was performed and pertinent positives and negatives are included.  Exam:  There were no vitals filed for this visit.  General appearance:  Normal Thyroid:  Symmetrical, normal in size, without palpable masses or nodularity. Respiratory  Auscultation:  Clear without wheezing or rhonchi Cardiovascular  Auscultation:  Regular rate, without rubs, murmurs or gallops  Edema/varicosities:  Not grossly evident Abdominal  Soft,nontender, without masses, guarding or rebound.  Liver/spleen:  No organomegaly noted  Hernia:  None appreciated  Skin  Inspection:  Grossly normal   Breasts: Examined lying and sitting.     Right: Without masses, retractions, discharge or axillary adenopathy.     Left: Without masses, retractions, discharge or axillary adenopathy. Gentitourinary   Inguinal/mons:  Normal without inguinal adenopathy  External genitalia:  Normal  BUS/Urethra/Skene's glands:  Normal  Vagina:  Normal  Cervix:  Normal  Uterus:  normal in size, shape and contour.  Midline and mobile  Adnexa/parametria:     Rt: Without masses or tenderness.   Lt: Without masses or tenderness.  Anus and perineum: Normal  Digital rectal exam: Normal sphincter tone without palpated masses or tenderness  Assessment/Plan:  44 y.o. MWF G1 P1 for annual exam with no complaints.  Amenorrheic on NuvaRing continuously  History GDM Left ear hearing  loss  Plan: NuvaRing prescription, proper use, slight risk for blood clots and strokes reviewed. SBE's, continue annual screening mammogram, dense tissue encouraged 3-D tomography. Continue healthy diet, regular exercise and healthy lifestyle. CBC, CMP, lipid panel, UA, Pap with HR HPV typing, new screening guidelines reviewed.  Plan:  [name] [facility], 10:01 AM 12/19/2014

## 2014-12-20 LAB — URINALYSIS W MICROSCOPIC + REFLEX CULTURE
Bacteria, UA: NONE SEEN [HPF]
Bilirubin Urine: NEGATIVE
CASTS: NONE SEEN [LPF]
Crystals: NONE SEEN [HPF]
Glucose, UA: NEGATIVE
Hgb urine dipstick: NEGATIVE
KETONES UR: NEGATIVE
Leukocytes, UA: NEGATIVE
Nitrite: NEGATIVE
PROTEIN: NEGATIVE
RBC / HPF: NONE SEEN RBC/HPF (ref <=2)
SQUAMOUS EPITHELIAL / LPF: NONE SEEN [HPF] (ref <=5)
Specific Gravity, Urine: 1.015 (ref 1.001–1.035)
Yeast: NONE SEEN [HPF]
pH: 5.5 (ref 5.0–8.0)

## 2014-12-21 ENCOUNTER — Encounter: Payer: Self-pay | Admitting: Women's Health

## 2014-12-21 LAB — URINE CULTURE
Colony Count: NO GROWTH
Organism ID, Bacteria: NO GROWTH

## 2014-12-22 LAB — CYTOLOGY - PAP

## 2015-02-16 ENCOUNTER — Other Ambulatory Visit: Payer: Self-pay

## 2015-02-16 DIAGNOSIS — Z1231 Encounter for screening mammogram for malignant neoplasm of breast: Secondary | ICD-10-CM

## 2015-03-27 ENCOUNTER — Ambulatory Visit
Admission: RE | Admit: 2015-03-27 | Discharge: 2015-03-27 | Disposition: A | Discharge status: Home / Self Care | Payer: BLUE CROSS/BLUE SHIELD | Source: Ambulatory Visit

## 2015-03-27 DIAGNOSIS — Z1231 Encounter for screening mammogram for malignant neoplasm of breast: Secondary | ICD-10-CM

## 2015-11-09 ENCOUNTER — Telehealth: Payer: Self-pay | Admitting: *Deleted

## 2015-11-09 NOTE — Telephone Encounter (Signed)
Spoke to patient and was advised that she is having some anxiety because her brother is having opened heart surgery tomorrow. Patient stated  and there is a strong family history of heart problems and she feels that she needs an EKG and maybe other test to make sure that she is okay.  Patient stated that she is not having any chest pain or any symptoms at this time. Patient stated that she is fine until Dr. Glori Bickers is back in the office. Offered patient an appointment with Allie Bossier NP which patient stated that she can not come in until Friday. Appointment scheduled for 11/13/15 with Anda Kraft.

## 2015-11-09 NOTE — Telephone Encounter (Signed)
[place] [facility] - Client Nonclinical Telephone Record [facility] - Client Client Site [place] Physician Loura Pardon - MD Contact Type Call Who Is Calling Patient / Member / Family / Caregiver Caller Name Breezy Daignault Caller Phone Number [phone] Patient Name Nichole Mcmahon Call Type Message Only Information Provided Reason for Call Request to Schedule Office Appointment Initial Comment Caller states, wants an appt about racing heart. She has questions about EKG appt. Call Closed By: Eather Colas Transaction Date/Time: 11/09/2015 10:48:52 AM (ET)

## 2015-11-09 NOTE — Telephone Encounter (Signed)
Noted and agree. 

## 2015-11-13 ENCOUNTER — Ambulatory Visit (INDEPENDENT_AMBULATORY_CARE_PROVIDER_SITE_OTHER): Payer: BLUE CROSS/BLUE SHIELD | Admitting: Primary Care

## 2015-11-13 ENCOUNTER — Encounter: Payer: Self-pay | Admitting: Primary Care

## 2015-11-13 VITALS — BP 102/62 | HR 75 | Temp 98.7°F | Ht 64.0 in | Wt 112.8 lb

## 2015-11-13 DIAGNOSIS — Z8249 Family history of ischemic heart disease and other diseases of the circulatory system: Secondary | ICD-10-CM | POA: Diagnosis not present

## 2015-11-13 NOTE — Progress Notes (Signed)
Subjective:    Patient ID: Nichole Mcmahon, female    DOB: 04/19/1971, 45 y.o.   MRN: [id number]  HPI  Nichole Mcmahon is a 45 year old female who presents today to discuss family history. She has a strong family history of heart disease including surgeries in both her mother and brother that have occurred this year. Her brother underwent cardiac catheterization recently with 5 blockages as a result.   She endorses a healthy lifestyle with healthy diet and regular exercise. She maintains a healthy weight and has a BMI of 19.3. She completed a lipid panel in March 2017 through her husbands occupation with:  Triglycerides 194, LDL 91, TC 178, LD 48.  She does not currently take Fish Oil or any OTC products for triglycerides. Denies chest pain, shortness of breath, dizziness, lower extremity edema. She does experience occasional palpitations when she's feeling anxious. Denies generalized anxiety disorder, panic attacks, depression.  She is concerned about her risk for heart disease given her family history and would like a general work up.  Review of Systems  Constitutional: Negative for fatigue.  Respiratory: Negative for shortness of breath.   Cardiovascular: Negative for chest pain and leg swelling.  Gastrointestinal: Negative for nausea.  Neurological: Negative for dizziness, numbness and headaches.  Psychiatric/Behavioral: The patient is not nervous/anxious.        Past Medical History  Diagnosis Date  . Depression   . Hx gestational diabetes   . History of eye infection      Social History   Social History  . Marital Status: Married    Spouse Name: N/A  . Number of Children: N/A  . Years of Education: N/A   Occupational History  . Not on file.   Social History Main Topics  . Smoking status: Never Smoker   . Smokeless tobacco: Not on file  . Alcohol Use: No  . Drug Use: No  . Sexual Activity: Yes    Birth Control/ Protection: Inserts   Other Topics Concern  . Not on  file   Social History Narrative    Past Surgical History  Procedure Laterality Date  . Rhinoplasty  2007    Family History  Problem Relation Age of Onset  . Diabetes Mother   . Hypertension Mother   . Other Mother     kidney transplant due to HTN  . Diabetes Maternal Grandmother   . Hypertension Maternal Grandmother   . Heart disease Maternal Grandmother   . Heart disease Maternal Grandfather     No Known Allergies  Current Outpatient Prescriptions on File Prior to Visit  Medication Sig Dispense Refill  . calcium carbonate (TUMS - DOSED IN MG ELEMENTAL CALCIUM) 500 MG chewable tablet Chew 1 tablet by mouth daily.    . Calcium Carbonate-Vitamin D (CALCIUM + D PO) Take by mouth.    . esomeprazole (NEXIUM) 40 MG capsule Take 40 mg by mouth daily before breakfast.      . etonogestrel-ethinyl estradiol (NUVARING) 0.12-0.015 MG/24HR vaginal ring Insert vaginally and leave in place for 3 consecutive weeks, then remove for 1 week. 3 each 4  . multivitamin (THERAGRAN) per tablet Take 1 tablet by mouth daily.       No current facility-administered medications on file prior to visit.    BP 102/62 mmHg  Pulse 75  Temp(Src) 98.7 F (37.1 C) (Oral)  Ht 5\' 4"  (1.626 m)  Wt 112 lb 12.8 oz (51.166 kg)  BMI 19.35 kg/m2  SpO2 98%  LMP 11/08/2015    Objective:   Physical Exam  Constitutional: She appears well-nourished.  Neck: No JVD present. Carotid bruit is not present.  Cardiovascular: Normal rate, regular rhythm and normal heart sounds.   No murmur heard. Pulmonary/Chest: Effort normal and breath sounds normal.  Skin: Skin is warm and dry.  Psychiatric: She has a normal mood and affect.          Assessment & Plan:  Cardiac Evaluation:  Strong family history of heart disease in both mother and brother. Brother recently with 5 blockages to coronary arteries.  She is asymptomatic, maintains a healthy weight through healthy diet and exercise. Recent lipid panel with  elevation in triglycerides at 194. Recommended to start with Fish Oil OTC and repeat lipids in 3 months.  ECG: NSR 68, no PVC/PAC's, ST depression/elevation, no LVH Strongly encouraged annual physical with PCP. Overall unremarkable evaluation today. Reassurance provided.  Sheral Flow, NP

## 2015-11-13 NOTE — Progress Notes (Signed)
Pre visit review using our clinic review tool, if applicable. No additional management support is needed unless otherwise documented below in the visit note. 

## 2015-11-13 NOTE — Patient Instructions (Signed)
Your ECG looks great!  I recommend to start Fish Oil 1000 mg daily with a meal as discussed.   We need to monitor your triglyceride levels which will need to be rechecked in 3 months. Please schedule a lab only appointment in 3 months for re-evaluation. Ensure you are fasting for at least 4 hours (water and black coffee only).   I highly recommend an annual physical with Dr. Glori Bickers so she can monitor your heart and cholesterol levels.  Continue your efforts towards a healthy lifestyle.  It was a pleasure meeting you!

## 2016-01-01 ENCOUNTER — Ambulatory Visit (INDEPENDENT_AMBULATORY_CARE_PROVIDER_SITE_OTHER): Payer: BLUE CROSS/BLUE SHIELD | Admitting: Women's Health

## 2016-01-01 ENCOUNTER — Encounter: Payer: Self-pay | Admitting: Women's Health

## 2016-01-01 VITALS — BP 112/76 | Ht 64.0 in | Wt 111.0 lb

## 2016-01-01 DIAGNOSIS — Z833 Family history of diabetes mellitus: Secondary | ICD-10-CM | POA: Diagnosis not present

## 2016-01-01 DIAGNOSIS — Z13220 Encounter for screening for lipoid disorders: Secondary | ICD-10-CM | POA: Diagnosis not present

## 2016-01-01 DIAGNOSIS — Z01419 Encounter for gynecological examination (general) (routine) without abnormal findings: Secondary | ICD-10-CM | POA: Diagnosis not present

## 2016-01-01 DIAGNOSIS — Z3040 Encounter for surveillance of contraceptives, unspecified: Secondary | ICD-10-CM | POA: Diagnosis not present

## 2016-01-01 LAB — LIPID PANEL
CHOLESTEROL: 174 mg/dL (ref 125–200)
HDL: 47 mg/dL (ref >=46)
LDL CALC: 97 mg/dL (ref <130)
TRIGLYCERIDES: 151 mg/dL — AB (ref <150)
Total CHOL/HDL Ratio: 3.7 Ratio (ref <=5.0)
VLDL: 30 mg/dL (ref <30)

## 2016-01-01 LAB — CBC WITH DIFFERENTIAL/PLATELET
Basophils Absolute: 87 cells/uL (ref 0–200)
Basophils Relative: 1 %
EOS PCT: 1 %
Eosinophils Absolute: 87 cells/uL (ref 15–500)
HEMATOCRIT: 37.6 % (ref 35.0–45.0)
Hemoglobin: 12.6 g/dL (ref 11.7–15.5)
LYMPHS PCT: 26 %
Lymphs Abs: 2262 cells/uL (ref 850–3900)
MCH: 30.7 pg (ref 27.0–33.0)
MCHC: 33.5 g/dL (ref 32.0–36.0)
MCV: 91.5 fL (ref 80.0–100.0)
MPV: 10.6 fL (ref 7.5–12.5)
Monocytes Absolute: 522 cells/uL (ref 200–950)
Monocytes Relative: 6 %
NEUTROS PCT: 66 %
Neutro Abs: 5742 cells/uL (ref 1500–7800)
Platelets: 323 10*3/uL (ref 140–400)
RBC: 4.11 MIL/uL (ref 3.80–5.10)
RDW: 13.2 % (ref 11.0–15.0)
WBC: 8.7 10*3/uL (ref 3.8–10.8)

## 2016-01-01 LAB — HEMOGLOBIN A1C
Hgb A1c MFr Bld: 4.8 % (ref <5.7)
MEAN PLASMA GLUCOSE: 91 mg/dL

## 2016-01-01 LAB — COMPREHENSIVE METABOLIC PANEL
ALBUMIN: 3.8 g/dL (ref 3.6–5.1)
ALT: 10 U/L (ref 6–29)
AST: 14 U/L (ref 10–35)
Alkaline Phosphatase: 28 U/L — ABNORMAL LOW (ref 33–115)
BILIRUBIN TOTAL: 0.3 mg/dL (ref 0.2–1.2)
BUN: 11 mg/dL (ref 7–25)
CALCIUM: 9.3 mg/dL (ref 8.6–10.2)
CHLORIDE: 104 mmol/L (ref 98–110)
CO2: 23 mmol/L (ref 20–31)
CREATININE: 0.65 mg/dL (ref 0.50–1.10)
Glucose, Bld: 80 mg/dL (ref 65–99)
Potassium: 4 mmol/L (ref 3.5–5.3)
Sodium: 138 mmol/L (ref 135–146)
TOTAL PROTEIN: 6.7 g/dL (ref 6.1–8.1)

## 2016-01-01 MED ORDER — ETONOGESTREL-ETHINYL ESTRADIOL 0.12-0.015 MG/24HR VA RING: VAGINAL_RING | VAGINAL | 4 refills | Status: DC

## 2016-01-01 NOTE — Progress Notes (Signed)
Nichole Mcmahon January 29, 1971 [place]:[id number]    History:    Presents for annual exam.  Roughly cycle on NuvaRing. Normal Pap and mammogram history. Has had a healthy lifestyle. History of GDM.  Past medical history, past surgical history, family history and social history were all reviewed and documented in the EPIC chart. 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 Mother numerous health problems diabetes, kidney transplant, heart disease, recent fractured legs. Brother open-heart surgery.  ROS:  A ROS was performed and pertinent positives and negatives are included.  Exam:  Vitals:   01/01/16 0808  BP: 112/76  Weight: 111 lb (50.3 kg)  Height: 5\' 4"  (1.626 m)   Body mass index is 19.05 kg/m.   General appearance:  Normal Thyroid:  Symmetrical, normal in size, without palpable masses or nodularity. Respiratory  Auscultation:  Clear without wheezing or rhonchi Cardiovascular  Auscultation:  Regular rate, without rubs, murmurs or gallops  Edema/varicosities:  Not grossly evident Abdominal  Soft,nontender, without masses, guarding or rebound.  Liver/spleen:  No organomegaly noted  Hernia:  None appreciated  Skin  Inspection:  Grossly normal   Breasts: Examined lying and sitting.     Right: Without masses, retractions, discharge or axillary adenopathy.     Left: Without masses, retractions, discharge or axillary adenopathy. Gentitourinary   Inguinal/mons:  Normal without inguinal adenopathy  External genitalia:  Normal  BUS/Urethra/Skene's glands:  Normal  Vagina:  Normal  Cervix:  Normal  Uterus:   normal in size, shape and contour.  Midline and mobile  Adnexa/parametria:     Rt: Without masses or tenderness.   Lt: Without masses or tenderness.  Anus and perineum: Normal  Digital rectal exam: Normal sphincter tone without palpated masses or tenderness  Assessment/Plan:  45 y.o. MWF G1 P1  for annual exam no complaints.  Monthly cycle on  NuvaRing  Plan: NuvaRing prescription, proper use, slight risk for blood clots and strokes reviewed. Encouraged to continue healthy lifestyle of exercise and diet. SBE's, annual screening mammogram, 3-D tomography reviewed and encouraged history of dense breasts. CBC, lipid panel, CMP, hemoglobin A1c, vitamin D, UA, Pap normal with negative HR HPV 2016, new screening guidelines reviewed.  [name] WHNP, 9:10 AM 01/01/2016

## 2016-01-01 NOTE — Patient Instructions (Signed)
Health Maintenance, Female Adopting a healthy lifestyle and getting preventive care can go a long way to promote health and wellness. Talk with your health care provider about what schedule of regular examinations is right for you. This is a good chance for you to check in with your provider about disease prevention and staying healthy. In between checkups, there are plenty of things you can do on your own. Experts have done a lot of research about which lifestyle changes and preventive measures are most likely to keep you healthy. Ask your health care provider for more information. WEIGHT AND DIET  Eat a healthy diet  Be sure to include plenty of vegetables, fruits, low-fat dairy products, and lean protein.  Do not eat a lot of foods high in solid fats, added sugars, or salt.  Get regular exercise. This is one of the most important things you can do for your health.  Most adults should exercise for at least 150 minutes each week. The exercise should increase your heart rate and make you sweat (moderate-intensity exercise).  Most adults should also do strengthening exercises at least twice a week. This is in addition to the moderate-intensity exercise.  Maintain a healthy weight  Body mass index (BMI) is a measurement that can be used to identify possible weight problems. It estimates body fat based on height and weight. Your health care provider can help determine your BMI and help you achieve or maintain a healthy weight.  For females 20 years of age and older:   A BMI below 18.5 is considered underweight.  A BMI of 18.5 to 24.9 is normal.  A BMI of 25 to 29.9 is considered overweight.  A BMI of 30 and above is considered obese.  Watch levels of cholesterol and blood lipids  You should start having your blood tested for lipids and cholesterol at 46 years of age, then have this test every 5 years.  You may need to have your cholesterol levels checked more often if:  Your lipid  or cholesterol levels are high.  You are older than 46 years of age.  You are at high risk for heart disease.  CANCER SCREENING   Lung Cancer  Lung cancer screening is recommended for adults 55-80 years old who are at high risk for lung cancer because of a history of smoking.  A yearly low-dose CT scan of the lungs is recommended for people who:  Currently smoke.  Have quit within the past 15 years.  Have at least a 30-pack-year history of smoking. A pack year is smoking an average of one pack of cigarettes a day for 1 year.  Yearly screening should continue until it has been 15 years since you quit.  Yearly screening should stop if you develop a health problem that would prevent you from having lung cancer treatment.  Breast Cancer  Practice breast self-awareness. This means understanding how your breasts normally appear and feel.  It also means doing regular breast self-exams. Let your health care provider know about any changes, no matter how small.  If you are in your 20s or 30s, you should have a clinical breast exam (CBE) by a health care provider every 1-3 years as part of a regular health exam.  If you are 40 or older, have a CBE every year. Also consider having a breast X-ray (mammogram) every year.  If you have a family history of breast cancer, talk to your health care provider about genetic screening.  If you   are at high risk for breast cancer, talk to your health care provider about having an MRI and a mammogram every year.  Breast cancer gene (BRCA) assessment is recommended for women who have family members with BRCA-related cancers. BRCA-related cancers include:  Breast.  Ovarian.  Tubal.  Peritoneal cancers.  Results of the assessment will determine the need for genetic counseling and BRCA1 and BRCA2 testing. Cervical Cancer Your health care provider may recommend that you be screened regularly for cancer of the pelvic organs (ovaries, uterus, and  vagina). This screening involves a pelvic examination, including checking for microscopic changes to the surface of your cervix (Pap test). You may be encouraged to have this screening done every 3 years, beginning at age 21.  For women ages 30-65, health care providers may recommend pelvic exams and Pap testing every 3 years, or they may recommend the Pap and pelvic exam, combined with testing for human papilloma virus (HPV), every 5 years. Some types of HPV increase your risk of cervical cancer. Testing for HPV may also be done on women of any age with unclear Pap test results.  Other health care providers may not recommend any screening for nonpregnant women who are considered low risk for pelvic cancer and who do not have symptoms. Ask your health care provider if a screening pelvic exam is right for you.  If you have had past treatment for cervical cancer or a condition that could lead to cancer, you need Pap tests and screening for cancer for at least 20 years after your treatment. If Pap tests have been discontinued, your risk factors (such as having a new sexual partner) need to be reassessed to determine if screening should resume. Some women have medical problems that increase the chance of getting cervical cancer. In these cases, your health care provider may recommend more frequent screening and Pap tests. Colorectal Cancer  This type of cancer can be detected and often prevented.  Routine colorectal cancer screening usually begins at 46 years of age and continues through 46 years of age.  Your health care provider may recommend screening at an earlier age if you have risk factors for colon cancer.  Your health care provider may also recommend using home test kits to check for hidden blood in the stool.  A small camera at the end of a tube can be used to examine your colon directly (sigmoidoscopy or colonoscopy). This is done to check for the earliest forms of colorectal  cancer.  Routine screening usually begins at age 50.  Direct examination of the colon should be repeated every 5-10 years through 46 years of age. However, you may need to be screened more often if early forms of precancerous polyps or small growths are found. Skin Cancer  Check your skin from head to toe regularly.  Tell your health care provider about any new moles or changes in moles, especially if there is a change in a mole's shape or color.  Also tell your health care provider if you have a mole that is larger than the size of a pencil eraser.  Always use sunscreen. Apply sunscreen liberally and repeatedly throughout the day.  Protect yourself by wearing long sleeves, pants, a wide-brimmed hat, and sunglasses whenever you are outside. HEART DISEASE, DIABETES, AND HIGH BLOOD PRESSURE   High blood pressure causes heart disease and increases the risk of stroke. High blood pressure is more likely to develop in:  People who have blood pressure in the high end   of the normal range (130-139/85-89 mm Hg).  People who are overweight or obese.  People who are African American.  If you are 38-23 years of age, have your blood pressure checked every 3-5 years. If you are 61 years of age or older, have your blood pressure checked every year. You should have your blood pressure measured twice--once when you are at a hospital or clinic, and once when you are not at a hospital or clinic. Record the average of the two measurements. To check your blood pressure when you are not at a hospital or clinic, you can use:  An automated blood pressure machine at a pharmacy.  A home blood pressure monitor.  If you are between 45 years and 39 years old, ask your health care provider if you should take aspirin to prevent strokes.  Have regular diabetes screenings. This involves taking a blood sample to check your fasting blood sugar level.  If you are at a normal weight and have a low risk for diabetes,  have this test once every three years after 46 years of age.  If you are overweight and have a high risk for diabetes, consider being tested at a younger age or more often. PREVENTING INFECTION  Hepatitis B  If you have a higher risk for hepatitis B, you should be screened for this virus. You are considered at high risk for hepatitis B if:  You were born in a country where hepatitis B is common. Ask your health care provider which countries are considered high risk.  Your parents were born in a high-risk country, and you have not been immunized against hepatitis B (hepatitis B vaccine).  You have HIV or AIDS.  You use needles to inject street drugs.  You live with someone who has hepatitis B.  You have had sex with someone who has hepatitis B.  You get hemodialysis treatment.  You take certain medicines for conditions, including cancer, organ transplantation, and autoimmune conditions. Hepatitis C  Blood testing is recommended for:  Everyone born from [birth year] through 1966.  Anyone with known risk factors for hepatitis C. Sexually transmitted infections (STIs)  You should be screened for sexually transmitted infections (STIs) including gonorrhea and chlamydia if:  You are sexually active and are younger than 46 years of age.  You are older than 46 years of age and your health care provider tells you that you are at risk for this type of infection.  Your sexual activity has changed since you were last screened and you are at an increased risk for chlamydia or gonorrhea. Ask your health care provider if you are at risk.  If you do not have HIV, but are at risk, it may be recommended that you take a prescription medicine daily to prevent HIV infection. This is called pre-exposure prophylaxis (PrEP). You are considered at risk if:  You are sexually active and do not regularly use condoms or know the HIV status of your partner(s).  You take drugs by injection.  You are sexually  active with a partner who has HIV. Talk with your health care provider about whether you are at high risk of being infected with HIV. If you choose to begin PrEP, you should first be tested for HIV. You should then be tested every 3 months for as long as you are taking PrEP.  PREGNANCY   If you are premenopausal and you may become pregnant, ask your health care provider about preconception counseling.  If you may  become pregnant, take 400 to 800 micrograms (mcg) of folic acid every day.  If you want to prevent pregnancy, talk to your health care provider about birth control (contraception). OSTEOPOROSIS AND MENOPAUSE   Osteoporosis is a disease in which the bones lose minerals and strength with aging. This can result in serious bone fractures. Your risk for osteoporosis can be identified using a bone density scan.  If you are 61 years of age or older, or if you are at risk for osteoporosis and fractures, ask your health care provider if you should be screened.  Ask your health care provider whether you should take a calcium or vitamin D supplement to lower your risk for osteoporosis.  Menopause may have certain physical symptoms and risks.  Hormone replacement therapy may reduce some of these symptoms and risks. Talk to your health care provider about whether hormone replacement therapy is right for you.  HOME CARE INSTRUCTIONS   Schedule regular health, dental, and eye exams.  Stay current with your immunizations.   Do not use any tobacco products including cigarettes, chewing tobacco, or electronic cigarettes.  If you are pregnant, do not drink alcohol.  If you are breastfeeding, limit how much and how often you drink alcohol.  Limit alcohol intake to no more than 1 drink per day for nonpregnant women. One drink equals 12 ounces of beer, 5 ounces of wine, or 1 ounces of hard liquor.  Do not use street drugs.  Do not share needles.  Ask your health care provider for help if  you need support or information about quitting drugs.  Tell your health care provider if you often feel depressed.  Tell your health care provider if you have ever been abused or do not feel safe at home.   This information is not intended to replace advice given to you by your health care provider. Make sure you discuss any questions you have with your health care provider.   Document Released: 11/15/2010 Document Revised: 05/23/2014 Document Reviewed: 04/03/2013 Elsevier Interactive Patient Education [employer].

## 2016-01-02 LAB — URINALYSIS W MICROSCOPIC + REFLEX CULTURE
BILIRUBIN URINE: NEGATIVE
Bacteria, UA: NONE SEEN [HPF]
CRYSTALS: NONE SEEN [HPF]
Casts: NONE SEEN [LPF]
GLUCOSE, UA: NEGATIVE
Hgb urine dipstick: NEGATIVE
KETONES UR: NEGATIVE
LEUKOCYTES UA: NEGATIVE
Nitrite: NEGATIVE
Protein, ur: NEGATIVE
RBC / HPF: NONE SEEN RBC/HPF (ref <=2)
SPECIFIC GRAVITY, URINE: 1.009 (ref 1.001–1.035)
Squamous Epithelial / LPF: NONE SEEN [HPF] (ref <=5)
WBC UA: NONE SEEN WBC/HPF (ref <=5)
Yeast: NONE SEEN [HPF]
pH: 5.5 (ref 5.0–8.0)

## 2016-01-02 LAB — VITAMIN D 25 HYDROXY (VIT D DEFICIENCY, FRACTURES): Vit D, 25-Hydroxy: 65 ng/mL (ref 30–100)

## 2016-02-19 ENCOUNTER — Other Ambulatory Visit: Payer: BLUE CROSS/BLUE SHIELD

## 2016-03-10 ENCOUNTER — Other Ambulatory Visit: Payer: Self-pay | Admitting: Gynecology

## 2016-03-10 DIAGNOSIS — Z1231 Encounter for screening mammogram for malignant neoplasm of breast: Secondary | ICD-10-CM

## 2016-04-01 ENCOUNTER — Ambulatory Visit
Admission: RE | Admit: 2016-04-01 | Discharge: 2016-04-01 | Disposition: A | Discharge status: Home / Self Care | Payer: BLUE CROSS/BLUE SHIELD | Source: Ambulatory Visit | Attending: Gynecology | Admitting: Gynecology

## 2016-04-01 DIAGNOSIS — Z1231 Encounter for screening mammogram for malignant neoplasm of breast: Secondary | ICD-10-CM

## 2016-10-18 ENCOUNTER — Encounter: Payer: Self-pay | Admitting: Primary Care

## 2016-10-18 ENCOUNTER — Ambulatory Visit (INDEPENDENT_AMBULATORY_CARE_PROVIDER_SITE_OTHER): Payer: BLUE CROSS/BLUE SHIELD | Admitting: Primary Care

## 2016-10-18 DIAGNOSIS — Z0000 Encounter for general adult medical examination without abnormal findings: Secondary | ICD-10-CM | POA: Insufficient documentation

## 2016-10-18 LAB — LIPID PANEL
CHOLESTEROL: 167 mg/dL (ref 0–200)
HDL: 48.4 mg/dL (ref >39.00)
LDL Cholesterol: 92 mg/dL (ref 0–99)
NonHDL: 118.29
Total CHOL/HDL Ratio: 3
Triglycerides: 130 mg/dL (ref 0.0–149.0)
VLDL: 26 mg/dL (ref 0.0–40.0)

## 2016-10-18 LAB — COMPREHENSIVE METABOLIC PANEL
ALBUMIN: 4.2 g/dL (ref 3.5–5.2)
ALT: 9 U/L (ref 0–35)
AST: 12 U/L (ref 0–37)
Alkaline Phosphatase: 33 U/L — ABNORMAL LOW (ref 39–117)
BILIRUBIN TOTAL: 0.5 mg/dL (ref 0.2–1.2)
BUN: 10 mg/dL (ref 6–23)
CALCIUM: 9.3 mg/dL (ref 8.4–10.5)
CHLORIDE: 104 meq/L (ref 96–112)
CO2: 27 mEq/L (ref 19–32)
CREATININE: 0.74 mg/dL (ref 0.40–1.20)
GFR: 89.52 mL/min (ref >60.00)
Glucose, Bld: 87 mg/dL (ref 70–99)
Potassium: 3.9 mEq/L (ref 3.5–5.1)
Sodium: 138 mEq/L (ref 135–145)
Total Protein: 7.1 g/dL (ref 6.0–8.3)

## 2016-10-18 NOTE — Assessment & Plan Note (Signed)
Immunizations UTD. Pap due in 2020, following with GYN. Mammogram due this Fall. Overall healthy diet. Discussed importance of regular exercise. Exam unremarkable. Labs pending. Follow up in 1 year for annual exam.

## 2016-10-18 NOTE — Progress Notes (Signed)
Subjective:    Patient ID: Nichole Mcmahon, female    DOB: 07/08/1970, 46 y.o.   MRN: [id number]  HPI  Nichole Mcmahon is a 46 year old female who presents today to establish care and for complete physical. Will obtain old records.   Immunizations: -Tetanus: Completed in 2015 -Influenza: Did complete last season   Diet: She endorses a healthy diet. Breakfast: Protein shake, oatmeal Lunch: Yogurt, granola, protein bars Dinner: Grilled chicken, veggies Snacks: Fiber one bar, protein bars Desserts: None Beverages: Almond Milk, Coffee, water  Exercise: She walks her dogs daily. Eye exam: Completed 2 years ago. Dental exam: Completes semi-annually Pap Smear: August 2017, Following with GYN Mammogram: Completed in October 2017.   Review of Systems  Constitutional: Negative for unexpected weight change.  HENT: Negative for rhinorrhea.   Respiratory: Negative for cough and shortness of breath.   Cardiovascular: Negative for chest pain.  Gastrointestinal: Negative for constipation and diarrhea.  Genitourinary: Negative for difficulty urinating and menstrual problem.  Musculoskeletal: Negative for arthralgias and myalgias.  Skin: Negative for rash.  Allergic/Immunologic: Negative for environmental allergies.  Neurological: Negative for dizziness, numbness and headaches.  Psychiatric/Behavioral:       Denies concerns for anxiety or depression       Past Medical History:  Diagnosis Date  . Depression   . History of eye infection   . Hx gestational diabetes   . Hyperlipidemia      Social History   Social History  . Marital status: Married    Spouse name: N/A  . Number of children: N/A  . Years of education: N/A   Occupational History  . Not on file.   Social History Main Topics  . Smoking status: Never Smoker  . Smokeless tobacco: Never Used  . Alcohol use No  . Drug use: No  . Sexual activity: Yes    Birth control/ protection: Inserts   Other Topics Concern  .  Not on file   Social History Narrative   Married.   1 son.   Works for a [employer].   Enjoys swimming, going to the beach.     Past Surgical History:  Procedure Laterality Date  . RHINOPLASTY  2007    Family History  Problem Relation Age of Onset  . Diabetes Mother   . Hypertension Mother   . Other Mother        kidney transplant due to HTN  . Diabetes Maternal Grandmother   . Hypertension Maternal Grandmother   . Heart disease Maternal Grandmother   . Heart disease Maternal Grandfather     No Known Allergies  Current Outpatient Prescriptions on File Prior to Visit  Medication Sig Dispense Refill  . b complex vitamins capsule Take 1 capsule by mouth daily.    . calcium carbonate (TUMS - DOSED IN MG ELEMENTAL CALCIUM) 500 MG chewable tablet Chew 1 tablet by mouth daily.    . Calcium Carbonate-Vitamin D (CALCIUM + D PO) Take by mouth.    . esomeprazole (NEXIUM) 40 MG capsule Take 40 mg by mouth daily before breakfast.      . etonogestrel-ethinyl estradiol (NUVARING) 0.12-0.015 MG/24HR vaginal ring Insert vaginally and leave in place for 3 consecutive weeks, then remove for 1 week. 3 each 4  . multivitamin (THERAGRAN) per tablet Take 1 tablet by mouth daily.      . Omega-3 Fatty Acids (FISH OIL OMEGA-3 PO) Take 1 capsule by mouth.      No current facility-administered medications  on file prior to visit.     BP 108/68   Pulse 65   Temp 98.2 F (36.8 C) (Oral)   Ht 5\' 4"  (1.626 m)   Wt 112 lb 6.4 oz (51 kg)   LMP 09/22/2016   SpO2 99%   BMI 19.29 kg/m    Objective:   Physical Exam  Constitutional: She is oriented to person, place, and time. She appears well-nourished.  HENT:  Right Ear: Tympanic membrane and ear canal normal.  Left Ear: Tympanic membrane and ear canal normal.  Nose: Nose normal.  Mouth/Throat: Oropharynx is clear and moist.  Eyes: Conjunctivae and EOM are normal. Pupils are equal, round, and reactive to light.  Neck: Neck supple. No  thyromegaly present.  Cardiovascular: Normal rate and regular rhythm.   No murmur heard. Pulmonary/Chest: Effort normal and breath sounds normal. She has no rales.  Abdominal: Soft. Bowel sounds are normal. There is no tenderness.  Musculoskeletal: Normal range of motion.  Lymphadenopathy:    She has no cervical adenopathy.  Neurological: She is alert and oriented to person, place, and time. She has normal reflexes. No cranial nerve deficit.  Skin: Skin is warm and dry. No rash noted.  Psychiatric: She has a normal mood and affect.          Assessment & Plan:

## 2016-10-18 NOTE — Patient Instructions (Signed)
Complete lab work prior to leaving today. I will notify you of your results once received.   Continue exercising. You should be getting 150 minutes of moderate intensity exercise weekly.  Continue your efforts on a healthy diet.  Follow up in 1 year for your annual exam or sooner if needed.  It was a pleasure to meet you today! Please don't hesitate to call me with any questions. Welcome to [facility]!

## 2017-01-03 ENCOUNTER — Encounter: Payer: Self-pay | Admitting: Women's Health

## 2017-01-03 ENCOUNTER — Ambulatory Visit (INDEPENDENT_AMBULATORY_CARE_PROVIDER_SITE_OTHER): Payer: BLUE CROSS/BLUE SHIELD | Admitting: Women's Health

## 2017-01-03 VITALS — BP 110/70 | Ht 64.0 in | Wt 113.0 lb

## 2017-01-03 DIAGNOSIS — Z3044 Encounter for surveillance of vaginal ring hormonal contraceptive device: Secondary | ICD-10-CM

## 2017-01-03 DIAGNOSIS — Z01419 Encounter for gynecological examination (general) (routine) without abnormal findings: Secondary | ICD-10-CM | POA: Diagnosis not present

## 2017-01-03 MED ORDER — ETONOGESTREL-ETHINYL ESTRADIOL 0.12-0.015 MG/24HR VA RING: VAGINAL_RING | VAGINAL | 4 refills | Status: DC

## 2017-01-03 NOTE — Progress Notes (Signed)
Nichole Mcmahon 1970/07/23 [id number]    History:    Presents for annual exam.  NuvaRing continuously/amenorrheic. Normal Pap and mammogram history. Labs at primary care had slightly elevated triglycerides. History of GDM.  Past medical history, past surgical history, family history and social history were all reviewed and documented in the EPIC chart. 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 Son 55 recently graduated from [place] doing well. Mother numerous health problems history kidney transplant. Brother open-heart surgery.  ROS:  A ROS was performed and pertinent positives and negatives are included.  Exam:  Vitals:   01/03/17 1113  BP: 110/70  Weight: 113 lb (51.3 kg)  Height: 5\' 4"  (1.626 m)   Body mass index is 19.4 kg/m.   General appearance:  Normal Thyroid:  Symmetrical, normal in size, without palpable masses or nodularity. Respiratory  Auscultation:  Clear without wheezing or rhonchi Cardiovascular  Auscultation:  Regular rate, without rubs, murmurs or gallops  Edema/varicosities:  Not grossly evident Abdominal  Soft,nontender, without masses, guarding or rebound.  Liver/spleen:  No organomegaly noted  Hernia:  None appreciated  Skin  Inspection:  Grossly normal   Breasts: Examined lying and sitting.     Right: Without masses, retractions, discharge or axillary adenopathy.     Left: Without masses, retractions, discharge or axillary adenopathy. Gentitourinary   Inguinal/mons:  Normal without inguinal adenopathy  External genitalia:  Normal  BUS/Urethra/Skene's glands:  Normal  Vagina:  Normal  Cervix:  Normal  Uterus: normal in size, shape and contour.  Midline and mobilen  Adnexa/parametria:     Rt: Without masses or tenderness.   Lt: Without masses or tenderness.  Anus and perineum: Normal  Digital rectal exam: Normal sphincter tone without palpated masses or tenderness  Assessment/Plan:  46 y.o. MWF G1 P1 for annual exam with no complaints.  Amenorrheic on  NuvaRing Labs-primary care  Plan: Options reviewed, would prefer to continue with NuvaRing, prescription, proper use given and reviewed slight risk for blood clots and strokes. SBE's, continue annual screening mammogram, calcium rich diet, vitamin D 2000 daily encouraged. Reviewed importance of weightbearing exercise and continued healthy lifestyle. Paps normal 2016, new screening guidelines reviewed.Nichole Mcmahon [facility], 11:55 AM 01/03/2017

## 2017-01-03 NOTE — Patient Instructions (Signed)

## 2017-02-18 ENCOUNTER — Other Ambulatory Visit: Payer: Self-pay | Admitting: Women's Health

## 2017-02-18 DIAGNOSIS — Z3040 Encounter for surveillance of contraceptives, unspecified: Secondary | ICD-10-CM

## 2017-02-22 ENCOUNTER — Other Ambulatory Visit: Payer: Self-pay | Admitting: Gynecology

## 2017-02-22 DIAGNOSIS — Z1231 Encounter for screening mammogram for malignant neoplasm of breast: Secondary | ICD-10-CM

## 2017-04-14 ENCOUNTER — Ambulatory Visit
Admission: RE | Admit: 2017-04-14 | Discharge: 2017-04-14 | Disposition: A | Discharge status: Home / Self Care | Payer: BLUE CROSS/BLUE SHIELD | Source: Ambulatory Visit | Attending: Gynecology | Admitting: Gynecology

## 2017-04-14 DIAGNOSIS — Z1231 Encounter for screening mammogram for malignant neoplasm of breast: Secondary | ICD-10-CM

## 2017-05-29 ENCOUNTER — Other Ambulatory Visit: Payer: Self-pay

## 2017-05-29 ENCOUNTER — Encounter (HOSPITAL_COMMUNITY): Payer: Self-pay

## 2017-05-29 DIAGNOSIS — R1111 Vomiting without nausea: Secondary | ICD-10-CM | POA: Insufficient documentation

## 2017-05-29 DIAGNOSIS — R1084 Generalized abdominal pain: Secondary | ICD-10-CM | POA: Diagnosis not present

## 2017-05-29 LAB — URINALYSIS, ROUTINE W REFLEX MICROSCOPIC
Bilirubin Urine: NEGATIVE
GLUCOSE, UA: NEGATIVE mg/dL
HGB URINE DIPSTICK: NEGATIVE
Ketones, ur: 5 mg/dL — AB
Leukocytes, UA: NEGATIVE
Nitrite: NEGATIVE
PH: 7 (ref 5.0–8.0)
PROTEIN: NEGATIVE mg/dL
Specific Gravity, Urine: 1.021 (ref 1.005–1.030)

## 2017-05-29 LAB — COMPREHENSIVE METABOLIC PANEL
ALBUMIN: 3.8 g/dL (ref 3.5–5.0)
ALT: 12 U/L — ABNORMAL LOW (ref 14–54)
AST: 16 U/L (ref 15–41)
Alkaline Phosphatase: 33 U/L — ABNORMAL LOW (ref 38–126)
Anion gap: 10 (ref 5–15)
BUN: 13 mg/dL (ref 6–20)
CHLORIDE: 102 mmol/L (ref 101–111)
CO2: 22 mmol/L (ref 22–32)
Calcium: 8.8 mg/dL — ABNORMAL LOW (ref 8.9–10.3)
Creatinine, Ser: 0.69 mg/dL (ref 0.44–1.00)
GFR calc Af Amer: >60 mL/min (ref >60)
GLUCOSE: 111 mg/dL — AB (ref 65–99)
POTASSIUM: 3.2 mmol/L — AB (ref 3.5–5.1)
SODIUM: 134 mmol/L — AB (ref 135–145)
Total Bilirubin: 0.4 mg/dL (ref 0.3–1.2)
Total Protein: 6.8 g/dL (ref 6.5–8.1)

## 2017-05-29 LAB — CBC
HEMATOCRIT: 37.9 % (ref 36.0–46.0)
Hemoglobin: 12.8 g/dL (ref 12.0–15.0)
MCH: 30.8 pg (ref 26.0–34.0)
MCHC: 33.8 g/dL (ref 30.0–36.0)
MCV: 91.3 fL (ref 78.0–100.0)
Platelets: 318 10*3/uL (ref 150–400)
RBC: 4.15 MIL/uL (ref 3.87–5.11)
RDW: 12.9 % (ref 11.5–15.5)
WBC: 12.4 10*3/uL — ABNORMAL HIGH (ref 4.0–10.5)

## 2017-05-29 LAB — I-STAT BETA HCG BLOOD, ED (MC, WL, AP ONLY): I-stat hCG, quantitative: <5 m[IU]/mL (ref <5)

## 2017-05-29 LAB — LIPASE, BLOOD: LIPASE: 33 U/L (ref 11–51)

## 2017-05-29 MED ORDER — FENTANYL CITRATE (PF) 100 MCG/2ML IJ SOLN
25.0000 ug | Freq: Once | INTRAMUSCULAR | Status: AC
  Administered 2017-05-29: 25 ug via NASAL
  Filled 2017-05-29: qty 2

## 2017-05-29 MED ORDER — OXYCODONE HCL 5 MG PO TABS
10.0000 mg | ORAL_TABLET | Freq: Once | ORAL | Status: AC
  Administered 2017-05-29: 10 mg via ORAL
  Filled 2017-05-29: qty 2

## 2017-05-29 NOTE — ED Triage Notes (Signed)
Pt states that she has had generalized abd pain since noon today, pain is getting worse, denies n/v/d

## 2017-05-29 NOTE — ED Notes (Signed)
Patient's family came to triage complaining that patient is in pain that she cannot bare.  This RN spoke with PA in pod A.  Gave orders of Oxy IR.  Upon giving medications patient became nauseated and threw up medications.  New order for Fentanyl intranasal. Patient tolerated medication well.

## 2017-05-30 ENCOUNTER — Emergency Department (HOSPITAL_COMMUNITY)
Admission: EM | Admit: 2017-05-30 | Discharge: 2017-05-30 | Disposition: A | Discharge status: Home / Self Care | Payer: Commercial Managed Care - PPO | Attending: Emergency Medicine | Admitting: Emergency Medicine

## 2017-05-30 DIAGNOSIS — R1084 Generalized abdominal pain: Secondary | ICD-10-CM

## 2017-05-30 DIAGNOSIS — R1111 Vomiting without nausea: Secondary | ICD-10-CM

## 2017-05-30 DIAGNOSIS — R109 Unspecified abdominal pain: Secondary | ICD-10-CM

## 2017-05-30 MED ORDER — DICYCLOMINE HCL 10 MG PO CAPS
10.0000 mg | ORAL_CAPSULE | Freq: Once | ORAL | Status: AC
Start: 2017-05-30 — End: 2017-05-30
  Administered 2017-05-30: 10 mg via ORAL
  Filled 2017-05-30: qty 1

## 2017-05-30 MED ORDER — DICYCLOMINE HCL 20 MG PO TABS: 20.0000 mg | ORAL_TABLET | Freq: Two times a day (BID) | ORAL | 0 refills | Status: DC

## 2017-05-30 MED ORDER — SUCRALFATE 1 G PO TABS: 1.0000 g | ORAL_TABLET | Freq: Three times a day (TID) | ORAL | 0 refills | Status: DC

## 2017-05-30 MED ORDER — ONDANSETRON 4 MG PO TBDP: 4.0000 mg | ORAL_TABLET | Freq: Three times a day (TID) | ORAL | 0 refills | Status: DC | PRN

## 2017-05-30 NOTE — ED Notes (Signed)
ED Provider at bedside. 

## 2017-05-30 NOTE — ED Provider Notes (Signed)
[facility] EMERGENCY DEPARTMENT Provider Note   CSN: [id number] Arrival date & time: 05/29/17  2011     History   Chief Complaint Chief Complaint  Patient presents with  . Abdominal Pain    HPI Nichole Mcmahon is a 47 y.o. female.  Nichole Mcmahon is a 47 y.o. Female who presents to the ED complaining of abdominal pain beginning around 12 pm today. Patient reports pain began around her epigastric region and then spread diffusely throughout her abdomen.  She reports that was a cramp-like pain that was generalized to her abdomen.  No focal areas of tenderness.  She reports a gradually worsened throughout the day until she presented to the emergency department.  She reports upon arrival to the emergency department she had one episode of nausea and vomiting after receiving an oxycodone pill.  She then received some intranasal fentanyl around 6 hours prior to my evaluation and reports this helped.  Right now patient reports she has mild generalized soreness but no other symptoms.  She does report having some burping today that seemed to make her pain feel better.  She is taking omeprazole daily.  She had no diarrhea.  She is not nauseated.  She denies having any chest pain or shortness of breath.  She had no previous abdominal surgeries.  No treatments attended prior to arrival to the emergency department.  She denies sick contacts.  She reports she has been more stressed at work recently.  She denies fevers, chest pain, shortness of breath, lightheadedness, dizziness, syncope, hematemesis, diarrhea, urinary symptoms, rashes, vaginal bleeding, vaginal discharge.    The history is provided by the patient and medical records. No language interpreter was used.  Abdominal Pain   Associated symptoms include nausea and vomiting. Pertinent negatives include fever, diarrhea, dysuria, frequency, hematuria and headaches.    Past Medical History:  Diagnosis Date  . Depression   .  History of eye infection   . Hx gestational diabetes   . Hyperlipidemia     Patient Active Problem List   Diagnosis Date Noted  . Preventative health care 10/18/2016  . BENIGN POSITIONAL VERTIGO 12/21/2007    Past Surgical History:  Procedure Laterality Date  . RHINOPLASTY  2007    OB History    Gravida Para Term Preterm AB Living   1 1       1    SAB TAB Ectopic Multiple Live Births                   Home Medications    Prior to Admission medications   Medication Sig Start Date End Date Taking? Authorizing Provider  b complex vitamins capsule Take 1 capsule by mouth daily.    [provider]  calcium carbonate (TUMS - DOSED IN MG ELEMENTAL CALCIUM) 500 MG chewable tablet Chew 1 tablet by mouth daily.    [provider]  Calcium Carbonate-Vitamin D (CALCIUM + D PO) Take by mouth.    [provider]  dicyclomine (BENTYL) 20 MG tablet Take 1 tablet (20 mg total) by mouth 2 (two) times daily. 05/30/17   Waynetta Pean, PA-C  esomeprazole (NEXIUM) 40 MG capsule Take 40 mg by mouth daily before breakfast.      [provider]  etonogestrel-ethinyl estradiol (NUVARING) 0.12-0.015 MG/24HR vaginal ring Insert vaginally and leave in place for 3 consecutive weeks, then remove for 1 week. 01/03/17 01/23/18  [name], NP  multivitamin [facility]) per tablet Take 1 tablet  by mouth daily.      [provider]  NUVARING 0.12-0.015 MG/24HR vaginal ring INSERT VAGINALLY AND LEAVE IN PLACE FOR 3 CONSECUTIVE WEEKS, THEN REMOVE FOR 1 WEEK. 02/20/17   [name], NP  Omega-3 Fatty Acids (FISH OIL OMEGA-3 PO) Take 1 capsule by mouth.     [provider]  ondansetron (ZOFRAN ODT) 4 MG disintegrating tablet Take 1 tablet (4 mg total) by mouth every 8 (eight) hours as needed for nausea or vomiting. 05/30/17   Waynetta Pean, PA-C  sucralfate (CARAFATE) 1 g tablet Take 1 tablet (1 g total) by mouth 4 (four) times daily -  with meals and at  bedtime. 05/30/17   Waynetta Pean, PA-C  tretinoin (RETIN-A) 0.05 % cream APPLY 1 APPLICATION APPLY ON THE SKIN AT BEDTIME 10/06/16   [provider]    Family History Family History  Problem Relation Age of Onset  . Diabetes Mother   . Hypertension Mother   . Other Mother        kidney transplant due to HTN  . Diabetes Maternal Grandmother   . Hypertension Maternal Grandmother   . Heart disease Maternal Grandmother   . Heart disease Maternal Grandfather     Social History Social History   Tobacco Use  . Smoking status: Never Smoker  . Smokeless tobacco: Never Used  Substance Use Topics  . Alcohol use: No    Alcohol/week: 0.0 oz  . Drug use: No     Allergies   Patient has no known allergies.   Review of Systems Review of Systems  Constitutional: Negative for chills and fever.  HENT: Negative for congestion and sore throat.   Eyes: Negative for visual disturbance.  Respiratory: Negative for cough, shortness of breath and wheezing.   Cardiovascular: Negative for chest pain and palpitations.  Gastrointestinal: Positive for abdominal pain, nausea and vomiting. Negative for blood in stool and diarrhea.  Genitourinary: Negative for difficulty urinating, dysuria, flank pain, frequency, hematuria, menstrual problem, pelvic pain, urgency, vaginal bleeding and vaginal discharge.  Musculoskeletal: Negative for back pain and neck pain.  Skin: Negative for rash.  Neurological: Negative for syncope, light-headedness and headaches.     Physical Exam Updated Vital Signs BP (!) 104/59   Pulse 76   Temp 98.2 F (36.8 C)   Resp 17   Ht 5\' 2"  (1.575 m)   Wt 50.8 kg (112 lb)   SpO2 100%   BMI 20.49 kg/m   Physical Exam  Constitutional: She appears well-developed and well-nourished.  Non-toxic appearance. She does not appear ill. No distress.  HENT:  Head: Normocephalic and atraumatic.  Mouth/Throat: Oropharynx is clear and moist.  Eyes: Conjunctivae are normal.  Pupils are equal, round, and reactive to light. Right eye exhibits no discharge. Left eye exhibits no discharge.  Neck: Neck supple.  Cardiovascular: Normal rate, regular rhythm, normal heart sounds and intact distal pulses. Exam reveals no gallop and no friction rub.  No murmur heard. Pulmonary/Chest: Effort normal and breath sounds normal. No respiratory distress. She has no wheezes. She has no rales.  Lungs are clear to ascultation bilaterally. Symmetric chest expansion bilaterally. No increased work of breathing. No rales or rhonchi.    Abdominal: Soft. Normal appearance and bowel sounds are normal. She exhibits no distension. There is no tenderness. There is no rigidity, no guarding and no CVA tenderness.  Abdomen is soft and nontender to palpation. Bowel sounds are present. No McBurney's point tenderness. Negative Murphy's sign. Negative Rovsing sign.  Negative psoas and obturator sign.   Musculoskeletal: She exhibits no edema.  Lymphadenopathy:    She has no cervical adenopathy.  Neurological: She is alert. Coordination normal.  Skin: Skin is warm and dry. No rash noted. She is not diaphoretic. No erythema. No pallor.  Psychiatric: She has a normal mood and affect. Her behavior is normal.  Nursing note and vitals reviewed.    ED Treatments / Results  Labs (all labs ordered are listed, but only abnormal results are displayed) Labs Reviewed  COMPREHENSIVE METABOLIC PANEL - Abnormal; Notable for the following components:      Result Value   Sodium 134 (*)    Potassium 3.2 (*)    Glucose, Bld 111 (*)    Calcium 8.8 (*)    ALT 12 (*)    Alkaline Phosphatase 33 (*)    All other components within normal limits  CBC - Abnormal; Notable for the following components:   WBC 12.4 (*)    All other components within normal limits  URINALYSIS, ROUTINE W REFLEX MICROSCOPIC - Abnormal; Notable for the following components:   APPearance CLOUDY (*)    Ketones, ur 5 (*)    All other components  within normal limits  LIPASE, BLOOD  I-STAT BETA HCG BLOOD, ED ([facility], [facility], [facility] ONLY)    EKG  EKG Interpretation None       Radiology No results found.  Procedures Procedures (including critical care time)  Medications Ordered in ED Medications  oxyCODONE (Oxy IR/ROXICODONE) immediate release tablet 10 mg (10 mg Oral Given 05/29/17 2133)  fentaNYL (SUBLIMAZE) injection 25 mcg (25 mcg Nasal Given 05/29/17 2152)  dicyclomine (BENTYL) capsule 10 mg (10 mg Oral Given 05/30/17 0323)     Initial Impression / Assessment and Plan / ED Course  I have reviewed the triage vital signs and the nursing notes.  Pertinent labs & imaging results that were available during my care of the patient were reviewed by me and considered in my medical decision making (see chart for details).    This is a 47 y.o. Female who presents to the ED complaining of abdominal pain beginning around 12 pm today. Patient reports pain began around her epigastric region and then spread diffusely throughout her abdomen.  She reports that was a cramp-like pain that was generalized to her abdomen.  No focal areas of tenderness.  She reports a gradually worsened throughout the day until she presented to the emergency department.  She reports upon arrival to the emergency department she had one episode of nausea and vomiting after receiving an oxycodone pill.  She then received some intranasal fentanyl around 6 hours prior to my evaluation and reports this helped.  Right now patient reports she has mild generalized soreness but no other symptoms.  She does report having some burping today that seemed to make her pain feel better.  She is taking omeprazole daily.  She had no diarrhea.  She is not nauseated.  She denies having any chest pain or shortness of breath.  She had no previous abdominal surgeries.  No treatments attended prior to arrival to the emergency department.  She denies sick contacts.  She reports she has been more  stressed at work recently. Husband and patient believe this may have contributed to her symptoms today.  On exam the patient is afebrile and non-toxic appearing.  Her abdomen is soft and nontender to palpation.  Bowel sounds are present.  No peritoneal signs. Pregnancy test is negative.  Lipase  is within normal limits.  CMP is remarkable only for potassium of 3.2.  CBC is more [place] for a mild leukocytosis with a white count of 12,400.  Urinalysis without sign of infection. Patient's fentanyl at this point has certainly worn off.  She is feeling much better currently.  I write the patient with Bentyl and I had her attempt p.o. trial.  At reevaluation after Bentyl and water patient is feeling much better.  She is no longer having any abdominal pain.  She is tolerating p.o. without nausea or vomiting.  She feels ready for discharge.  Will discharge with a prescription for Bentyl.  We will also provide her with a prescription for Zofran to use as needed.  I encouraged her to continue taking her omeprazole and will do a short course of Carafate in case this was related to reflux.  I encouraged her to follow-up closely with primary care. I advised the patient to follow-up with their primary care provider this week. I advised the patient to return to the emergency department with new or worsening symptoms or new concerns. The patient verbalized understanding and agreement with plan.      Final Clinical Impressions(s) / ED Diagnoses   Final diagnoses:  Generalized abdominal pain  Abdominal cramping  Non-intractable vomiting without nausea, unspecified vomiting type    ED Discharge Orders        Ordered    dicyclomine (BENTYL) 20 MG tablet  2 times daily     05/30/17 0350    ondansetron (ZOFRAN ODT) 4 MG disintegrating tablet  Every 8 hours PRN     05/30/17 0350    sucralfate (CARAFATE) 1 g tablet  3 times daily with meals & bedtime     05/30/17 0350       Waynetta Pean, PA-C 05/30/17 0356      Orpah Greek, MD 05/30/17 [phone]

## 2017-10-20 ENCOUNTER — Ambulatory Visit (INDEPENDENT_AMBULATORY_CARE_PROVIDER_SITE_OTHER): Payer: Commercial Managed Care - PPO | Admitting: Primary Care

## 2017-10-20 ENCOUNTER — Encounter: Payer: Self-pay | Admitting: Primary Care

## 2017-10-20 VITALS — BP 108/64 | HR 61 | Temp 98.6°F | Ht 63.0 in | Wt 112.2 lb

## 2017-10-20 DIAGNOSIS — K219 Gastro-esophageal reflux disease without esophagitis: Secondary | ICD-10-CM | POA: Diagnosis not present

## 2017-10-20 DIAGNOSIS — Z0000 Encounter for general adult medical examination without abnormal findings: Secondary | ICD-10-CM | POA: Diagnosis not present

## 2017-10-20 LAB — LIPID PANEL
Cholesterol: 174 mg/dL (ref 0–200)
HDL: 42.7 mg/dL (ref >39.00)
LDL CALC: 100 mg/dL — AB (ref 0–99)
NONHDL: 131.42
TRIGLYCERIDES: 156 mg/dL — AB (ref 0.0–149.0)
Total CHOL/HDL Ratio: 4
VLDL: 31.2 mg/dL (ref 0.0–40.0)

## 2017-10-20 LAB — COMPREHENSIVE METABOLIC PANEL
ALT: 9 U/L (ref 0–35)
AST: 11 U/L (ref 0–37)
Albumin: 4.1 g/dL (ref 3.5–5.2)
Alkaline Phosphatase: 30 U/L — ABNORMAL LOW (ref 39–117)
BUN: 13 mg/dL (ref 6–23)
CHLORIDE: 105 meq/L (ref 96–112)
CO2: 26 meq/L (ref 19–32)
CREATININE: 0.8 mg/dL (ref 0.40–1.20)
Calcium: 9.3 mg/dL (ref 8.4–10.5)
GFR: 81.47 mL/min (ref >60.00)
GLUCOSE: 91 mg/dL (ref 70–99)
Potassium: 4.3 mEq/L (ref 3.5–5.1)
Sodium: 139 mEq/L (ref 135–145)
Total Bilirubin: 0.4 mg/dL (ref 0.2–1.2)
Total Protein: 7 g/dL (ref 6.0–8.3)

## 2017-10-20 NOTE — Patient Instructions (Addendum)
Stop by the lab prior to leaving today. I will notify you of your results once received.   Continue exercising. You should be getting 150 minutes of moderate intensity exercise weekly.  Ensure you are consuming 64 ounces of water daily.  Continue to eat a healthy diet.  Follow up in 1 year for your annual exam or sooner if needed.  It was a pleasure to see you today!   Preventive Care 40-64 Years, Female Preventive care refers to lifestyle choices and visits with your health care provider that can promote health and wellness. What does preventive care include?  A yearly physical exam. This is also called an annual well check.  Dental exams once or twice a year.  Routine eye exams. Ask your health care provider how often you should have your eyes checked.  Personal lifestyle choices, including: ? Daily care of your teeth and gums. ? Regular physical activity. ? Eating a healthy diet. ? Avoiding tobacco and drug use. ? Limiting alcohol use. ? Practicing safe sex. ? Taking low-dose aspirin daily starting at age 60. ? Taking vitamin and mineral supplements as recommended by your health care provider. What happens during an annual well check? The services and screenings done by your health care provider during your annual well check will depend on your age, overall health, lifestyle risk factors, and family history of disease. Counseling Your health care provider may ask you questions about your:  Alcohol use.  Tobacco use.  Drug use.  Emotional well-being.  Home and relationship well-being.  Sexual activity.  Eating habits.  Work and work Statistician.  Method of birth control.  Menstrual cycle.  Pregnancy history.  Screening You may have the following tests or measurements:  Height, weight, and BMI.  Blood pressure.  Lipid and cholesterol levels. These may be checked every 5 years, or more frequently if you are over 47 years old.  Skin check.  Lung  cancer screening. You may have this screening every year starting at age 42 if you have a 30-pack-year history of smoking and currently smoke or have quit within the past 15 years.  Fecal occult blood test (FOBT) of the stool. You may have this test every year starting at age 25.  Flexible sigmoidoscopy or colonoscopy. You may have a sigmoidoscopy every 5 years or a colonoscopy every 10 years starting at age 26.  Hepatitis C blood test.  Hepatitis B blood test.  Sexually transmitted disease (STD) testing.  Diabetes screening. This is done by checking your blood sugar (glucose) after you have not eaten for a while (fasting). You may have this done every 1-3 years.  Mammogram. This may be done every 1-2 years. Talk to your health care provider about when you should start having regular mammograms. This may depend on whether you have a family history of breast cancer.  BRCA-related cancer screening. This may be done if you have a family history of breast, ovarian, tubal, or peritoneal cancers.  Pelvic exam and Pap test. This may be done every 3 years starting at age 70. Starting at age 30, this may be done every 5 years if you have a Pap test in combination with an HPV test.  Bone density scan. This is done to screen for osteoporosis. You may have this scan if you are at high risk for osteoporosis.  Discuss your test results, treatment options, and if necessary, the need for more tests with your health care provider. Vaccines Your health care provider may recommend  certain vaccines, such as:  Influenza vaccine. This is recommended every year.  Tetanus, diphtheria, and acellular pertussis (Tdap, Td) vaccine. You may need a Td booster every 10 years.  Varicella vaccine. You may need this if you have not been vaccinated.  Zoster vaccine. You may need this after age 44.  Measles, mumps, and rubella (MMR) vaccine. You may need at least one dose of MMR if you were born in 1957 or later. You  may also need a second dose.  Pneumococcal 13-valent conjugate (PCV13) vaccine. You may need this if you have certain conditions and were not previously vaccinated.  Pneumococcal polysaccharide (PPSV23) vaccine. You may need one or two doses if you smoke cigarettes or if you have certain conditions.  Meningococcal vaccine. You may need this if you have certain conditions.  Hepatitis A vaccine. You may need this if you have certain conditions or if you travel or work in places where you may be exposed to hepatitis A.  Hepatitis B vaccine. You may need this if you have certain conditions or if you travel or work in places where you may be exposed to hepatitis B.  Haemophilus influenzae type b (Hib) vaccine. You may need this if you have certain conditions.  Talk to your health care provider about which screenings and vaccines you need and how often you need them. This information is not intended to replace advice given to you by your health care provider. Make sure you discuss any questions you have with your health care provider. Document Released: 05/29/2015 Document Revised: 01/20/2016 Document Reviewed: 03/03/2015 Elsevier Interactive Patient Education  [employer].

## 2017-10-20 NOTE — Assessment & Plan Note (Signed)
Taking Nexium daily, doing well on this dose.

## 2017-10-20 NOTE — Assessment & Plan Note (Signed)
Immunizations UTD. Pap smear due in August, follows with GYN and is scheduled. Mammogram UTD. Commended her on a healthy lifestyle, encouraged to continue. Exam unremarkable. Labs pending. Follow up in 1 year for CPE.

## 2017-10-20 NOTE — Progress Notes (Signed)
Subjective:    Patient ID: Nichole Mcmahon, female    DOB: 03-Sep-1970, 47 y.o.   MRN: [id number]  HPI  Nichole Mcmahon is a 47 year old female who presents today for complete physical. She has no complaints today.  Immunizations: -Tetanus: Completed in 2015 -Influenza: Completed last season   Diet: She endorses a healthy diet. Breakfast: Oatmeal Lunch: Salad, protein Dinner: Salad, protein Snacks: Yogurt, granola, fiber one bar, protein bar Desserts: None Beverages: Almond milk  Exercise: She is doing cardio 4-5 times weekly, 30 minutes. Eye exam: Due in July 2019 Dental exam: Completes regularly  Pap Smear: Completed in 2016, follows with GYN Mammogram: Completed in November 2018   Review of Systems  Constitutional: Negative for unexpected weight change.  HENT: Negative for rhinorrhea.   Respiratory: Negative for cough and shortness of breath.   Cardiovascular: Negative for chest pain.  Gastrointestinal: Negative for constipation and diarrhea.  Genitourinary: Negative for difficulty urinating and menstrual problem.  Musculoskeletal: Negative for arthralgias and myalgias.  Skin: Negative for rash.  Allergic/Immunologic: Negative for environmental allergies.  Neurological: Negative for dizziness, numbness and headaches.  Psychiatric/Behavioral: The patient is not nervous/anxious.        Past Medical History:  Diagnosis Date  . Depression   . History of eye infection   . Hx gestational diabetes   . Hyperlipidemia      Social History   Socioeconomic History  . Marital status: Married    Spouse name: Not on file  . Number of children: Not on file  . Years of education: Not on file  . Highest education level: Not on file  Occupational History  . Not on file  Social Needs  . Financial resource strain: Not on file  . Food insecurity:    Worry: Not on file    Inability: Not on file  . Transportation needs:    Medical: Not on file    Non-medical: Not on file    Tobacco Use  . Smoking status: Never Smoker  . Smokeless tobacco: Never Used  Substance and Sexual Activity  . Alcohol use: No    Alcohol/week: 0.0 oz  . Drug use: No  . Sexual activity: Yes    Birth control/protection: Inserts    Comment: DES NEG, DECLINED INSURANCE QUESTIONS  Lifestyle  . Physical activity:    Days per week: Not on file    Minutes per session: Not on file  . Stress: Not on file  Relationships  . Social connections:    Talks on phone: Not on file    Gets together: Not on file    Attends religious service: Not on file    Active member of club or organization: Not on file    Attends meetings of clubs or organizations: Not on file    Relationship status: Not on file  . Intimate partner violence:    Fear of current or ex partner: Not on file    Emotionally abused: Not on file    Physically abused: Not on file    Forced sexual activity: Not on file  Other Topics Concern  . Not on file  Social History Narrative   Married.   1 son.   Works for a [employer].   Enjoys swimming, going to the beach.     Past Surgical History:  Procedure Laterality Date  . RHINOPLASTY  2007    Family History  Problem Relation Age of Onset  . Diabetes Mother   .  Hypertension Mother   . Other Mother        kidney transplant due to HTN  . Diabetes Maternal Grandmother   . Hypertension Maternal Grandmother   . Heart disease Maternal Grandmother   . Heart disease Maternal Grandfather     No Known Allergies  Current Outpatient Medications on File Prior to Visit  Medication Sig Dispense Refill  . b complex vitamins capsule Take 1 capsule by mouth daily.    . calcium carbonate (TUMS - DOSED IN MG ELEMENTAL CALCIUM) 500 MG chewable tablet Chew 1 tablet by mouth daily.    . Calcium Carbonate-Vitamin D (CALCIUM + D PO) Take by mouth.    . esomeprazole (NEXIUM) 40 MG capsule Take 40 mg by mouth daily before breakfast.      . etonogestrel-ethinyl estradiol (NUVARING)  0.12-0.015 MG/24HR vaginal ring Insert vaginally and leave in place for 3 consecutive weeks, then remove for 1 week. 3 each 4  . multivitamin (THERAGRAN) per tablet Take 1 tablet by mouth daily.      . Omega-3 Fatty Acids (FISH OIL OMEGA-3 PO) Take 1 capsule by mouth.     . tretinoin (RETIN-A) 0.05 % cream APPLY 1 APPLICATION APPLY ON THE SKIN AT BEDTIME  2   No current facility-administered medications on file prior to visit.     BP 108/64   Pulse 61   Temp 98.6 F (37 C) (Oral)   Ht 5\' 3"  (1.6 m)   Wt 112 lb 4 oz (50.9 kg)   LMP 10/15/2017   SpO2 98%   BMI 19.88 kg/m    Objective:   Physical Exam  Constitutional: She is oriented to person, place, and time. She appears well-nourished.  HENT:  Mouth/Throat: No oropharyngeal exudate.  Eyes: Pupils are equal, round, and reactive to light. EOM are normal.  Neck: Neck supple. No thyromegaly present.  Cardiovascular: Normal rate and regular rhythm.  Respiratory: Effort normal and breath sounds normal.  GI: Soft. Bowel sounds are normal. There is no tenderness.  Musculoskeletal: Normal range of motion.  Neurological: She is alert and oriented to person, place, and time.  Skin: Skin is warm and dry.  Psychiatric: She has a normal mood and affect.           Assessment & Plan:

## 2017-11-24 DIAGNOSIS — H52223 Regular astigmatism, bilateral: Secondary | ICD-10-CM | POA: Diagnosis not present

## 2017-11-24 DIAGNOSIS — H40033 Anatomical narrow angle, bilateral: Secondary | ICD-10-CM | POA: Diagnosis not present

## 2017-11-24 DIAGNOSIS — H524 Presbyopia: Secondary | ICD-10-CM | POA: Diagnosis not present

## 2017-11-26 DIAGNOSIS — S63501D Unspecified sprain of right wrist, subsequent encounter: Secondary | ICD-10-CM | POA: Diagnosis not present

## 2017-11-26 DIAGNOSIS — S7221XD Displaced subtrochanteric fracture of right femur, subsequent encounter for closed fracture with routine healing: Secondary | ICD-10-CM | POA: Diagnosis not present

## 2017-11-26 DIAGNOSIS — E785 Hyperlipidemia, unspecified: Secondary | ICD-10-CM | POA: Diagnosis not present

## 2017-11-27 DIAGNOSIS — S63501D Unspecified sprain of right wrist, subsequent encounter: Secondary | ICD-10-CM | POA: Diagnosis not present

## 2017-11-27 DIAGNOSIS — S7221XD Displaced subtrochanteric fracture of right femur, subsequent encounter for closed fracture with routine healing: Secondary | ICD-10-CM | POA: Diagnosis not present

## 2017-11-27 DIAGNOSIS — E785 Hyperlipidemia, unspecified: Secondary | ICD-10-CM | POA: Diagnosis not present

## 2017-11-28 DIAGNOSIS — S7221XD Displaced subtrochanteric fracture of right femur, subsequent encounter for closed fracture with routine healing: Secondary | ICD-10-CM | POA: Diagnosis not present

## 2017-11-28 DIAGNOSIS — E785 Hyperlipidemia, unspecified: Secondary | ICD-10-CM | POA: Diagnosis not present

## 2017-11-28 DIAGNOSIS — S63501D Unspecified sprain of right wrist, subsequent encounter: Secondary | ICD-10-CM | POA: Diagnosis not present

## 2017-12-01 DIAGNOSIS — E785 Hyperlipidemia, unspecified: Secondary | ICD-10-CM | POA: Diagnosis not present

## 2017-12-01 DIAGNOSIS — S63501D Unspecified sprain of right wrist, subsequent encounter: Secondary | ICD-10-CM | POA: Diagnosis not present

## 2017-12-01 DIAGNOSIS — S7221XD Displaced subtrochanteric fracture of right femur, subsequent encounter for closed fracture with routine healing: Secondary | ICD-10-CM | POA: Diagnosis not present

## 2017-12-02 DIAGNOSIS — S63501D Unspecified sprain of right wrist, subsequent encounter: Secondary | ICD-10-CM | POA: Diagnosis not present

## 2017-12-02 DIAGNOSIS — S7221XD Displaced subtrochanteric fracture of right femur, subsequent encounter for closed fracture with routine healing: Secondary | ICD-10-CM | POA: Diagnosis not present

## 2017-12-02 DIAGNOSIS — E785 Hyperlipidemia, unspecified: Secondary | ICD-10-CM | POA: Diagnosis not present

## 2017-12-20 ENCOUNTER — Other Ambulatory Visit: Payer: Self-pay

## 2017-12-20 ENCOUNTER — Encounter (HOSPITAL_COMMUNITY): Payer: Self-pay

## 2017-12-20 ENCOUNTER — Emergency Department (HOSPITAL_COMMUNITY): Payer: Commercial Managed Care - PPO

## 2017-12-20 ENCOUNTER — Inpatient Hospital Stay (HOSPITAL_COMMUNITY)
Admission: EM | Admit: 2017-12-20 | Discharge: 2017-12-24 | DRG: 481 | Disposition: A | Discharge status: Home Health | Payer: Commercial Managed Care - PPO | Attending: Student | Admitting: Student

## 2017-12-20 DIAGNOSIS — Z419 Encounter for procedure for purposes other than remedying health state, unspecified: Secondary | ICD-10-CM

## 2017-12-20 DIAGNOSIS — S7221XA Displaced subtrochanteric fracture of right femur, initial encounter for closed fracture: Secondary | ICD-10-CM | POA: Diagnosis not present

## 2017-12-20 DIAGNOSIS — S72001A Fracture of unspecified part of neck of right femur, initial encounter for closed fracture: Secondary | ICD-10-CM

## 2017-12-20 DIAGNOSIS — Z833 Family history of diabetes mellitus: Secondary | ICD-10-CM | POA: Diagnosis not present

## 2017-12-20 DIAGNOSIS — Y92002 Bathroom of unspecified non-institutional (private) residence single-family (private) house as the place of occurrence of the external cause: Secondary | ICD-10-CM | POA: Diagnosis not present

## 2017-12-20 DIAGNOSIS — K219 Gastro-esophageal reflux disease without esophagitis: Secondary | ICD-10-CM | POA: Diagnosis not present

## 2017-12-20 DIAGNOSIS — R42 Dizziness and giddiness: Secondary | ICD-10-CM | POA: Diagnosis not present

## 2017-12-20 DIAGNOSIS — S7291XA Unspecified fracture of right femur, initial encounter for closed fracture: Secondary | ICD-10-CM

## 2017-12-20 DIAGNOSIS — S72141D Displaced intertrochanteric fracture of right femur, subsequent encounter for closed fracture with routine healing: Secondary | ICD-10-CM | POA: Diagnosis not present

## 2017-12-20 DIAGNOSIS — S6991XA Unspecified injury of right wrist, hand and finger(s), initial encounter: Secondary | ICD-10-CM | POA: Diagnosis not present

## 2017-12-20 DIAGNOSIS — D62 Acute posthemorrhagic anemia: Secondary | ICD-10-CM | POA: Diagnosis not present

## 2017-12-20 DIAGNOSIS — M25531 Pain in right wrist: Secondary | ICD-10-CM | POA: Diagnosis present

## 2017-12-20 DIAGNOSIS — Z8249 Family history of ischemic heart disease and other diseases of the circulatory system: Secondary | ICD-10-CM

## 2017-12-20 DIAGNOSIS — S72141A Displaced intertrochanteric fracture of right femur, initial encounter for closed fracture: Secondary | ICD-10-CM | POA: Diagnosis not present

## 2017-12-20 DIAGNOSIS — W182XXA Fall in (into) shower or empty bathtub, initial encounter: Secondary | ICD-10-CM | POA: Diagnosis present

## 2017-12-20 DIAGNOSIS — R0902 Hypoxemia: Secondary | ICD-10-CM | POA: Diagnosis not present

## 2017-12-20 DIAGNOSIS — S299XXA Unspecified injury of thorax, initial encounter: Secondary | ICD-10-CM | POA: Diagnosis not present

## 2017-12-20 DIAGNOSIS — W19XXXA Unspecified fall, initial encounter: Secondary | ICD-10-CM | POA: Diagnosis not present

## 2017-12-20 DIAGNOSIS — S7290XA Unspecified fracture of unspecified femur, initial encounter for closed fracture: Secondary | ICD-10-CM

## 2017-12-20 DIAGNOSIS — S72331A Displaced oblique fracture of shaft of right femur, initial encounter for closed fracture: Secondary | ICD-10-CM | POA: Diagnosis not present

## 2017-12-20 HISTORY — DX: Fracture of unspecified part of neck of right femur, initial encounter for closed fracture: S72.001A

## 2017-12-20 HISTORY — DX: Displaced subtrochanteric fracture of right femur, initial encounter for closed fracture: S72.21XA

## 2017-12-20 LAB — CBC WITH DIFFERENTIAL/PLATELET
Abs Immature Granulocytes: 0.1 10*3/uL (ref 0.0–0.1)
BASOS ABS: 0.1 10*3/uL (ref 0.0–0.1)
BASOS PCT: 0 %
EOS ABS: 0.1 10*3/uL (ref 0.0–0.7)
Eosinophils Relative: 1 %
HCT: 36.4 % (ref 36.0–46.0)
HEMOGLOBIN: 11.9 g/dL — AB (ref 12.0–15.0)
Immature Granulocytes: 1 %
LYMPHS PCT: 31 %
Lymphs Abs: 4.2 10*3/uL — ABNORMAL HIGH (ref 0.7–4.0)
MCH: 31.2 pg (ref 26.0–34.0)
MCHC: 32.7 g/dL (ref 30.0–36.0)
MCV: 95.5 fL (ref 78.0–100.0)
Monocytes Absolute: 0.7 10*3/uL (ref 0.1–1.0)
Monocytes Relative: 5 %
Neutro Abs: 8.6 10*3/uL — ABNORMAL HIGH (ref 1.7–7.7)
Neutrophils Relative %: 62 %
PLATELETS: 286 10*3/uL (ref 150–400)
RBC: 3.81 MIL/uL — AB (ref 3.87–5.11)
RDW: 12.8 % (ref 11.5–15.5)
WBC: 13.7 10*3/uL — AB (ref 4.0–10.5)

## 2017-12-20 LAB — BASIC METABOLIC PANEL
ANION GAP: 9 (ref 5–15)
BUN: 14 mg/dL (ref 6–20)
CALCIUM: 8.3 mg/dL — AB (ref 8.9–10.3)
CO2: 23 mmol/L (ref 22–32)
Chloride: 105 mmol/L (ref 98–111)
Creatinine, Ser: 0.67 mg/dL (ref 0.44–1.00)
Glucose, Bld: 141 mg/dL — ABNORMAL HIGH (ref 70–99)
POTASSIUM: 3.2 mmol/L — AB (ref 3.5–5.1)
SODIUM: 137 mmol/L (ref 135–145)

## 2017-12-20 LAB — I-STAT BETA HCG BLOOD, ED (MC, WL, AP ONLY)

## 2017-12-20 LAB — ABO/RH: ABO/RH(D): O POS

## 2017-12-20 MED ORDER — HYDROMORPHONE HCL 1 MG/ML IJ SOLN
0.5000 mg | Freq: Once | INTRAMUSCULAR | Status: AC
  Administered 2017-12-20: 0.5 mg via INTRAVENOUS
  Filled 2017-12-20: qty 1

## 2017-12-20 MED ORDER — ONDANSETRON HCL 4 MG/2ML IJ SOLN
4.0000 mg | Freq: Once | INTRAMUSCULAR | Status: AC
  Administered 2017-12-20: 4 mg via INTRAVENOUS
  Filled 2017-12-20: qty 2

## 2017-12-20 MED ORDER — DEXTROSE 5 % IV SOLN
500.0000 mg | Freq: Four times a day (QID) | INTRAVENOUS | Status: DC | PRN
  Administered 2017-12-21: 500 mg via INTRAVENOUS
  Filled 2017-12-20: qty 5

## 2017-12-20 MED ORDER — OXYCODONE-ACETAMINOPHEN 5-325 MG PO TABS
2.0000 | ORAL_TABLET | Freq: Four times a day (QID) | ORAL | Status: DC | PRN
  Administered 2017-12-21 – 2017-12-23 (×4): 2 via ORAL
  Filled 2017-12-20 (×5): qty 2

## 2017-12-20 MED ORDER — METOCLOPRAMIDE HCL 10 MG PO TABS
5.0000 mg | ORAL_TABLET | Freq: Three times a day (TID) | ORAL | Status: DC | PRN
  Administered 2017-12-22: 10 mg via ORAL
  Filled 2017-12-20 (×2): qty 1

## 2017-12-20 MED ORDER — HYDROMORPHONE HCL 1 MG/ML IJ SOLN
1.0000 mg | Freq: Once | INTRAMUSCULAR | Status: AC
  Administered 2017-12-20: 1 mg via INTRAVENOUS
  Filled 2017-12-20: qty 1

## 2017-12-20 MED ORDER — OXYCODONE-ACETAMINOPHEN 5-325 MG PO TABS
1.0000 | ORAL_TABLET | ORAL | Status: DC | PRN
  Administered 2017-12-22 – 2017-12-24 (×8): 1 via ORAL
  Filled 2017-12-20 (×8): qty 1

## 2017-12-20 MED ORDER — METOCLOPRAMIDE HCL 5 MG/ML IJ SOLN
5.0000 mg | Freq: Three times a day (TID) | INTRAMUSCULAR | Status: DC | PRN
  Administered 2017-12-20 – 2017-12-21 (×2): 10 mg via INTRAVENOUS
  Filled 2017-12-20 (×2): qty 2

## 2017-12-20 MED ORDER — DIPHENHYDRAMINE HCL 12.5 MG/5ML PO ELIX: 12.5000 mg | ORAL_SOLUTION | ORAL | Status: DC | PRN

## 2017-12-20 MED ORDER — ACETAMINOPHEN 650 MG RE SUPP: 650.0000 mg | Freq: Four times a day (QID) | RECTAL | Status: DC | PRN

## 2017-12-20 MED ORDER — PANTOPRAZOLE SODIUM 40 MG PO TBEC
40.0000 mg | DELAYED_RELEASE_TABLET | Freq: Every day | ORAL | Status: DC
  Administered 2017-12-22 – 2017-12-24 (×3): 40 mg via ORAL
  Filled 2017-12-20 (×3): qty 1

## 2017-12-20 MED ORDER — MORPHINE SULFATE (PF) 2 MG/ML IV SOLN
2.0000 mg | INTRAVENOUS | Status: DC | PRN
  Administered 2017-12-20 – 2017-12-23 (×6): 2 mg via INTRAVENOUS
  Filled 2017-12-20 (×7): qty 1

## 2017-12-20 MED ORDER — KETOROLAC TROMETHAMINE 15 MG/ML IJ SOLN
15.0000 mg | Freq: Four times a day (QID) | INTRAMUSCULAR | Status: AC
  Administered 2017-12-20 – 2017-12-21 (×4): 15 mg via INTRAVENOUS
  Filled 2017-12-20 (×4): qty 1

## 2017-12-20 MED ORDER — METHOCARBAMOL 500 MG PO TABS
500.0000 mg | ORAL_TABLET | Freq: Four times a day (QID) | ORAL | Status: DC | PRN
  Administered 2017-12-21 – 2017-12-24 (×7): 500 mg via ORAL
  Filled 2017-12-20 (×7): qty 1

## 2017-12-20 MED ORDER — ACETAMINOPHEN 325 MG PO TABS: 650.0000 mg | ORAL_TABLET | Freq: Four times a day (QID) | ORAL | Status: DC | PRN

## 2017-12-20 NOTE — Anesthesia Preprocedure Evaluation (Addendum)
Anesthesia Evaluation  Patient identified by MRN, date of birth, ID band Patient awake    Reviewed: Allergy & Precautions, NPO status , Patient's Chart, lab work & pertinent test results  Airway Mallampati: I       Dental no notable dental hx. (+) Teeth Intact   Pulmonary neg pulmonary ROS,    Pulmonary exam normal breath sounds clear to auscultation       Cardiovascular Normal cardiovascular exam Rhythm:Regular Rate:Normal     Neuro/Psych PSYCHIATRIC DISORDERS Depression negative neurological ROS     GI/Hepatic Neg liver ROS, GERD  Medicated,  Endo/Other  negative endocrine ROS  Renal/GU negative Renal ROS     Musculoskeletal negative musculoskeletal ROS (+)   Abdominal Normal abdominal exam  (+)   Peds  Hematology negative hematology ROS (+)   Anesthesia Other Findings   Reproductive/Obstetrics                            Anesthesia Physical Anesthesia Plan  ASA: II  Anesthesia Plan: General   Post-op Pain Management:    Induction: Intravenous, Rapid sequence and Cricoid pressure planned  PONV Risk Score and Plan: 4 or greater and Ondansetron and Dexamethasone  Airway Management Planned: Oral ETT  Additional Equipment:   Intra-op Plan:   Post-operative Plan: Extubation in OR  Informed Consent: I have reviewed the patients History and Physical, chart, labs and discussed the procedure including the risks, benefits and alternatives for the proposed anesthesia with the patient or authorized representative who has indicated his/her understanding and acceptance.   Dental advisory given  Plan Discussed with: CRNA and Surgeon  Anesthesia Plan Comments:        Anesthesia Quick Evaluation

## 2017-12-20 NOTE — ED Provider Notes (Signed)
[facility] EMERGENCY DEPARTMENT Provider Note   CSN: [id number] Arrival date & time: 12/20/17  1922     History   Chief Complaint Chief Complaint  Patient presents with  . Fall  . Hip Pain    HPI Nichole Mcmahon is a 47 y.o. female with hyperlipidemia who presents after a mechanical fall in her shower.  She says that she slipped in the shower and injured her right leg and right wrist.  She did not hit her head.  She denies headache and neck pain.  She is not any anticoagulants.  No LOC.  She says the pain in her right leg and hip are severe and sharp.  She received fentanyl in the field.  HPI  Past Medical History:  Diagnosis Date  . Depression   . History of eye infection   . Hx gestational diabetes   . Hyperlipidemia     Patient Active Problem List   Diagnosis Date Noted  . Closed displaced subtrochanteric fracture of right femur ([place]) 12/20/2017  . Closed right hip fracture, initial encounter ([place]) 12/20/2017  . Displaced subtrochanteric fracture of right femur, initial encounter for closed fracture ([place]) 12/20/2017  . GERD (gastroesophageal reflux disease) 10/20/2017  . Preventative health care 10/18/2016  . BENIGN POSITIONAL VERTIGO 12/21/2007    Past Surgical History:  Procedure Laterality Date  . RHINOPLASTY  2007     OB History    Gravida  1   Para  1   Term      Preterm      AB      Living  1     SAB      TAB      Ectopic      Multiple      Live Births               Home Medications    Prior to Admission medications   Medication Sig Start Date End Date Taking? Authorizing Provider  b complex vitamins capsule Take 1 capsule by mouth daily.   Yes [provider]  Calcium Carbonate-Vitamin D (CALCIUM + D PO) Take 1 tablet by mouth daily.    Yes [provider]  esomeprazole (NEXIUM) 40 MG capsule Take 40 mg by mouth daily before breakfast.     Yes [provider]  etonogestrel-ethinyl  estradiol (NUVARING) 0.12-0.015 MG/24HR vaginal ring Insert vaginally and leave in place for 3 consecutive weeks, then remove for 1 week. Patient taking differently: Place 1 each vaginally every 28 (twenty-eight) days. Insert vaginally and leave in place for 3 consecutive weeks, then remove for 1 week. 01/03/17 01/23/18 Yes [name], NP  multivitamin [facility]) per tablet Take 1 tablet by mouth daily.     Yes [provider]  Omega-3 Fatty Acids (FISH OIL OMEGA-3 PO) Take 1 capsule by mouth daily.    Yes [provider]    Family History Family History  Problem Relation Age of Onset  . Diabetes Mother   . Hypertension Mother   . Other Mother        kidney transplant due to HTN  . Diabetes Maternal Grandmother   . Hypertension Maternal Grandmother   . Heart disease Maternal Grandmother   . Heart disease Maternal Grandfather     Social History Social History   Tobacco Use  . Smoking status: Never Smoker  . Smokeless tobacco: Never Used  Substance Use Topics  . Alcohol use: No    Alcohol/week:  0.0 oz  . Drug use: No     Allergies   Patient has no known allergies.   Review of Systems Review of Systems Review of Systems   Constitutional  Negative for fever  Negative for chills  HENT  Negative for ear pain  Negative for sore throat  Negative for difficultly swallowing  Eyes  Negative for eye pain  Negative for visual disturbance  Respiratory  Negative for shortness of breath  Negative for cough  CV  Negative for chest pain  Negative for leg swelling  Abdomen  Negative for abdominal pain  Negative for nausea  Negative for vomiting  MSK  +for extremity pain  Negative for back pain  Skin  Negative for rash  Negative for wound  Neuro  Negative for syncope  Negative for difficultly speaking  Psych  Negative for confusion   The remainder of the ROS was reviewed and negative except as documented above.      Physical  Exam Updated Vital Signs BP 139/74   Pulse 87   Temp 98 F (36.7 C) (Oral)   Resp 16   Ht 5\' 3"  (1.6 m)   Wt 50.8 kg (112 lb)   LMP 12/13/2017   SpO2 99%   BMI 19.84 kg/m   Physical Exam Physical Exam Constitutional  Nursing notes reviewed  Vital signs reviewed  Clearly in pain  HEENT  No obvious trauma  Supple without meningismus, mass, or overt JVD  EOMI  No scleral icterus or injection  Respiratory  Effort normal  CTAB  No respiratory distress  CV  Normal rate  No obvious murmurs  No pitting edema  Abdomen  Soft  Non-tender  Non-distended  No peritonitis  MSK  Right leg shortended and internally rotated  Pain with palpation along the right femur  Neurovascularly intact in all extremities  Right wrist tender, no snuff box tenderness  Skin  Warm  Dry  No open wounds  Neuro  Awake and alert  EOMI  Moving all extremities  Psychiatric  Mood and affect normal        ED Treatments / Results  Labs (all labs ordered are listed, but only abnormal results are displayed) Labs Reviewed  CBC WITH DIFFERENTIAL/PLATELET - Abnormal; Notable for the following components:      Result Value   WBC 13.7 (*)    RBC 3.81 (*)    Hemoglobin 11.9 (*)    Neutro Abs 8.6 (*)    Lymphs Abs 4.2 (*)    All other components within normal limits  BASIC METABOLIC PANEL - Abnormal; Notable for the following components:   Potassium 3.2 (*)    Glucose, Bld 141 (*)    Calcium 8.3 (*)    All other components within normal limits  I-STAT BETA HCG BLOOD, ED ([facility], [facility], [facility] ONLY)  TYPE AND SCREEN  ABO/RH    EKG None  Radiology Dg Chest 1 View  Result Date: 12/20/2017 CLINICAL DATA:  Status post fall, with concern for chest injury. Initial encounter. EXAM: CHEST  1 VIEW COMPARISON:  None. FINDINGS: The lungs are well-aerated and clear. There is no evidence of focal opacification, pleural effusion or pneumothorax. The cardiomediastinal silhouette is within  normal limits. No acute osseous abnormalities are seen. IMPRESSION: No acute cardiopulmonary process seen. No displaced rib fractures identified. Electronically Signed   By: Garald Balding M.D.   On: 12/20/2017 21:15   Dg Wrist Complete Right  Result Date: 12/20/2017 CLINICAL DATA:  Status post  fall, with acute onset of right wrist pain. Initial encounter. EXAM: RIGHT WRIST - COMPLETE 3+ VIEW COMPARISON:  None. FINDINGS: There is no evidence of fracture or dislocation. The carpal rows are intact, and demonstrate normal alignment. The joint spaces are preserved. Mild negative ulnar variance is noted. No significant soft tissue abnormalities are seen. IMPRESSION: No evidence of fracture or dislocation. Electronically Signed   By: Garald Balding M.D.   On: 12/20/2017 21:16   Dg Knee Right Port  Result Date: 12/20/2017 CLINICAL DATA:  Status post fall, with known right femur fracture. Assess for fracture at the knee. Initial encounter. EXAM: PORTABLE RIGHT KNEE - 1-2 VIEW COMPARISON:  None. FINDINGS: There is no evidence of fracture or dislocation. The joint spaces are preserved. No significant degenerative change is seen; the patellofemoral joint is grossly unremarkable in appearance. No significant joint effusion is seen. The visualized soft tissues are normal in appearance. IMPRESSION: No evidence of fracture or dislocation. Electronically Signed   By: Garald Balding M.D.   On: 12/20/2017 22:31   Dg Hip Unilat  With Pelvis 2-3 Views Right  Result Date: 12/20/2017 CLINICAL DATA:  Status post fall in shower, with right hip rotation and pain. Initial encounter. EXAM: DG HIP (WITH OR WITHOUT PELVIS) 2-3V RIGHT COMPARISON:  None. FINDINGS: There is a prominent displaced oblique fracture through the proximal right femur, intertrochanteric in nature, though involving the proximal femoral diaphysis. The right femoral head remains seated at the acetabulum. No significant joint space narrowing is seen. The left hip  joint is grossly unremarkable. The sacroiliac joints are unremarkable in appearance. The visualized bowel gas pattern is within normal limits. Soft tissue swelling is noted about the proximal right thigh. IMPRESSION: Prominent displaced oblique fracture through the proximal right femur, intertrochanteric in nature, though involving the proximal femoral diaphysis. Electronically Signed   By: Garald Balding M.D.   On: 12/20/2017 21:15    Procedures Procedures (including critical care time)  Medications Ordered in ED Medications  pantoprazole (PROTONIX) EC tablet 40 mg (has no administration in time range)  acetaminophen (TYLENOL) tablet 650 mg (has no administration in time range)    Or  acetaminophen (TYLENOL) suppository 650 mg (has no administration in time range)  ketorolac (TORADOL) 15 MG/ML injection 15 mg (15 mg Intravenous Given 12/20/17 2305)  oxyCODONE-acetaminophen (PERCOCET/ROXICET) 5-325 MG per tablet 1 tablet (has no administration in time range)  oxyCODONE-acetaminophen (PERCOCET/ROXICET) 5-325 MG per tablet 2 tablet (has no administration in time range)  morphine 2 MG/ML injection 2 mg (2 mg Intravenous Given 12/20/17 2305)  methocarbamol (ROBAXIN) tablet 500 mg (has no administration in time range)    Or  methocarbamol (ROBAXIN) 500 mg in dextrose 5 % 50 mL IVPB (has no administration in time range)  diphenhydrAMINE (BENADRYL) 12.5 MG/5ML elixir 12.5-25 mg (has no administration in time range)  metoCLOPramide (REGLAN) tablet 5-10 mg ( Oral See Alternative 12/20/17 2305)    Or  metoCLOPramide (REGLAN) injection 5-10 mg (10 mg Intravenous Given 12/20/17 2305)  HYDROmorphone (DILAUDID) injection 1 mg (1 mg Intravenous Given 12/20/17 2000)  HYDROmorphone (DILAUDID) injection 0.5 mg (0.5 mg Intravenous Given 12/20/17 2113)  ondansetron (ZOFRAN) injection 4 mg (4 mg Intravenous Given 12/20/17 2113)     Initial Impression / Assessment and Plan / ED Course  I have reviewed the triage vital  signs and the nursing notes.  Pertinent labs & imaging results that were available during my care of the patient were reviewed by me and considered in  my medical decision making (see chart for details).  Clinical Course as of Dec 21 2350  Wed Dec 20, 2017  2029 JANDI SWIGER is a 47 year old female who presents after a mechanical fall.She is hemodynamically stable.  On exam, her right lower extremity is shortened and internally rotated.  She also has right wrist tenderness.  Her wrist is also neurovascularly intact.  Due to her severe pain, she was given 1 mg of Dilaudid.  CBC, BMP, pregnancy test and type and screen were sent.  Imaging of the hip,chest, and right wrist are pending.     [NA]  2115 On my review, there is an obvious right oblique displaced femur fracture.  Ortho paged.  No obvious injuries on chest x-ray or on the right wrist films. Formal reads negative for chest and wrist.  [NA]  2123 I discussed her care with orthopedics.  They are coming into the hospital to evaluate her.  She was admitted to the hospitalist.  She remained in stable condition.   [NA]    Clinical Course User Index [NA] Alford Highland, MD    Final Clinical Impressions(s) / ED Diagnoses   Final diagnoses:  Femur fracture, right [facility])    ED Discharge Orders    None       Alford Highland, MD 12/20/17 Brantley, [place], MD 12/24/17 1506

## 2017-12-20 NOTE — ED Triage Notes (Signed)
Per [facility], Pt slipped and fell in shower. Rotation noted to right leg. PMS intact. Pt given 268mcg of fentanyl in route. VS 133/74, HR 86, RR 16, sp02 96% on RA.

## 2017-12-20 NOTE — ED Notes (Signed)
Resident at bedside.  

## 2017-12-20 NOTE — ED Notes (Signed)
Requested newer of hospital beds

## 2017-12-20 NOTE — H&P (Signed)
[facility]opaedic Trauma [facility] (OTS) Consult   Patient ID: Nichole Mcmahon MRN: [id number] DOB/AGE: 09-18-1970 47 y.o.  Reason for Consult:Right Femur Fracture Referring Physician: Dr. Varney Biles, MD [facility]  HPI: Nichole Mcmahon is an 47 y.o. female who is being seen in consultation at the request of Dr. Kathrynn Humble  for evaluation of right femur fracture.  The patient was in her usual state of health when she slipped and fell in the shower and landed on her right hip.  He she had immediate pain and deformity and inability to bear weight.  He MS was called and she was brought to the emergency room.  X-rays showed a subtrochanteric femur fracture.  I was consulted for evaluation and treatment.  Currently the patient denies any other areas of pain.  She was in her usual state of health.  Denies any major medical problems.  She is ambulatory without assist device.  She is medical assistance in a dentist office here in [place].  She lives with her husband and son in a one-story home with 3 steps to enter.  She denies any significant tobacco use or drug use.  Past Medical History:  Diagnosis Date  . Depression   . History of eye infection   . Hx gestational diabetes   . Hyperlipidemia     Past Surgical History:  Procedure Laterality Date  . RHINOPLASTY  2007    Family History  Problem Relation Age of Onset  . Diabetes Mother   . Hypertension Mother   . Other Mother        kidney transplant due to HTN  . Diabetes Maternal Grandmother   . Hypertension Maternal Grandmother   . Heart disease Maternal Grandmother   . Heart disease Maternal Grandfather     Social History:  reports that she has never smoked. She has never used smokeless tobacco. She reports that she does not drink alcohol or use drugs.  Allergies: No Known Allergies  Medications:  No current facility-administered medications on file prior to encounter.    Current Outpatient Medications on File Prior to  Encounter  Medication Sig Dispense Refill  . b complex vitamins capsule Take 1 capsule by mouth daily.    . Calcium Carbonate-Vitamin D (CALCIUM + D PO) Take 1 tablet by mouth daily.     Marland Kitchen esomeprazole (NEXIUM) 40 MG capsule Take 40 mg by mouth daily before breakfast.      . etonogestrel-ethinyl estradiol (NUVARING) 0.12-0.015 MG/24HR vaginal ring Insert vaginally and leave in place for 3 consecutive weeks, then remove for 1 week. (Patient taking differently: Place 1 each vaginally every 28 (twenty-eight) days. Insert vaginally and leave in place for 3 consecutive weeks, then remove for 1 week.) 3 each 4  . multivitamin (THERAGRAN) per tablet Take 1 tablet by mouth daily.      . Omega-3 Fatty Acids (FISH OIL OMEGA-3 PO) Take 1 capsule by mouth daily.       ROS: Constitutional: No fever or chills Vision: No changes in vision ENT: No difficulty swallowing CV: No chest pain Pulm: No SOB or wheezing GI: No nausea or vomiting GU: No urgency or inability to hold urine Skin: No poor wound healing Neurologic: No numbness or tingling Psychiatric: +Depression Heme: No bruising Allergic: No reaction to medications or food   Exam: Blood pressure 133/68, pulse 80, temperature 98 F (36.7 C), temperature source Oral, resp. rate 16, height 5\' 3"  (1.6 m), weight 50.8 kg (112 lb), last menstrual period 12/13/2017,  SpO2 98 %. General: Obvious discomfort and pain Orientation: Awake alert and oriented x3 Mood and Affect: Cooperative and pleasant Gait: Unable to assess due to her fracture Coordination and balance: Within normal limits  Right lower extremity is shortened and externally rotated.  No obvious skin lesions or abrasions.  The remainder of her leg is without any instability or deformity.  She is unable to move her knee secondary to pain in her hip.  She is active dorsiflexion plantarflexion with great toe extension.  She endorses sensation in the dorsum and plantar aspect of her foot.  Reflexes  were not assessed.  She has no lymphadenopathy.  Left lower extremity: Skin without lesions. No tenderness to palpation. Full painless ROM, full strength in each muscle groups without evidence of instability.   Medical Decision Making: Imaging: AP pelvis and 2 views of the right femur show a spiral oblique subtrochanteric femur fracture.  X-rays of the knee show no distal fracture.  Labs:  Results for orders placed or performed during the hospital encounter of 12/20/17 (from the past 24 hour(s))  CBC with Differential     Status: Abnormal   Collection Time: 12/20/17  7:48 PM  Result Value Ref Range   WBC 13.7 (H) 4.0 - 10.5 K/uL   RBC 3.81 (L) 3.87 - 5.11 MIL/uL   Hemoglobin 11.9 (L) 12.0 - 15.0 g/dL   HCT 36.4 36.0 - 46.0 %   MCV 95.5 78.0 - 100.0 fL   MCH 31.2 26.0 - 34.0 pg   MCHC 32.7 30.0 - 36.0 g/dL   RDW 12.8 11.5 - 15.5 %   Platelets 286 150 - 400 K/uL   Neutrophils Relative % 62 %   Neutro Abs 8.6 (H) 1.7 - 7.7 K/uL   Lymphocytes Relative 31 %   Lymphs Abs 4.2 (H) 0.7 - 4.0 K/uL   Monocytes Relative 5 %   Monocytes Absolute 0.7 0.1 - 1.0 K/uL   Eosinophils Relative 1 %   Eosinophils Absolute 0.1 0.0 - 0.7 K/uL   Basophils Relative 0 %   Basophils Absolute 0.1 0.0 - 0.1 K/uL   Immature Granulocytes 1 %   Abs Immature Granulocytes 0.1 0.0 - 0.1 K/uL  Basic metabolic panel     Status: Abnormal   Collection Time: 12/20/17  7:48 PM  Result Value Ref Range   Sodium 137 135 - 145 mmol/L   Potassium 3.2 (L) 3.5 - 5.1 mmol/L   Chloride 105 98 - 111 mmol/L   CO2 23 22 - 32 mmol/L   Glucose, Bld 141 (H) 70 - 99 mg/dL   BUN 14 6 - 20 mg/dL   Creatinine, Ser 0.67 0.44 - 1.00 mg/dL   Calcium 8.3 (L) 8.9 - 10.3 mg/dL   GFR calc non Af Amer >60 >60 mL/min   GFR calc Af Amer >60 >60 mL/min   Anion gap 9 5 - 15  ABO/Rh     Status: None   Collection Time: 12/20/17  7:48 PM  Result Value Ref Range   ABO/RH(D)      O POS Performed at [facility], [address],  [place], [place] [address]   I-Stat Beta hCG blood, ED ([facility], [facility], [facility] only)     Status: None   Collection Time: 12/20/17  7:54 PM  Result Value Ref Range   I-stat hCG, quantitative <5.0 <5 mIU/mL   Comment 3          Type and screen [place]  Status: None   Collection Time: 12/20/17  8:21 PM  Result Value Ref Range   ABO/RH(D) O POS    Antibody Screen NEG    Sample Expiration      12/23/2017 Performed at [facility], [place] [address] [place],  [address]    Medical history and chart was reviewed  Assessment/Plan: 47 year old female status post fall with right subtrochanteric femur fracture  I reviewed the x-rays with the patient and her family.  This is an operative fracture at her age and ambulatory status.  We will plan to proceed with intramedullary nailing with likely recon screws tomorrow morning.  Risks and benefits were discussed with the patient. Risks discussed included bleeding requiring blood transfusion, bleeding causing a hematoma, infection, malunion, nonunion, damage to surrounding nerves and blood vessels, pain, hardware prominence or irritation, hardware failure, stiffness, post-traumatic arthritis, DVT/PE, compartment syndrome, and even death. The patient agrees to proceed with surgery.  Plan to put the patient in Buck's traction overnight.    Shona Needles, MD [facility] [phone] (phone)

## 2017-12-21 ENCOUNTER — Inpatient Hospital Stay (HOSPITAL_COMMUNITY): Payer: Commercial Managed Care - PPO | Admitting: Anesthesiology

## 2017-12-21 ENCOUNTER — Inpatient Hospital Stay (HOSPITAL_COMMUNITY): Payer: Commercial Managed Care - PPO

## 2017-12-21 ENCOUNTER — Encounter (HOSPITAL_COMMUNITY)
Admission: EM | Disposition: A | Discharge status: Home Health | Payer: Self-pay | Source: Home / Self Care | Attending: Student

## 2017-12-21 ENCOUNTER — Other Ambulatory Visit: Payer: Self-pay

## 2017-12-21 HISTORY — PX: FEMUR IM NAIL: SHX1597

## 2017-12-21 SURGERY — INSERTION, INTRAMEDULLARY ROD, FEMUR
Anesthesia: General | Laterality: Right

## 2017-12-21 MED ORDER — SUGAMMADEX SODIUM 200 MG/2ML IV SOLN
INTRAVENOUS | Status: DC | PRN
  Administered 2017-12-21: 100 mg via INTRAVENOUS

## 2017-12-21 MED ORDER — KETOROLAC TROMETHAMINE 30 MG/ML IJ SOLN: 30.0000 mg | Freq: Once | INTRAMUSCULAR | Status: DC | PRN

## 2017-12-21 MED ORDER — ONDANSETRON HCL 4 MG/2ML IJ SOLN
INTRAMUSCULAR | Status: AC
  Filled 2017-12-21: qty 2

## 2017-12-21 MED ORDER — HYDROMORPHONE HCL 1 MG/ML IJ SOLN
INTRAMUSCULAR | Status: AC
  Filled 2017-12-21: qty 1

## 2017-12-21 MED ORDER — DEXAMETHASONE SODIUM PHOSPHATE 10 MG/ML IJ SOLN
INTRAMUSCULAR | Status: DC | PRN
  Administered 2017-12-21: 10 mg via INTRAVENOUS

## 2017-12-21 MED ORDER — FENTANYL CITRATE (PF) 250 MCG/5ML IJ SOLN
INTRAMUSCULAR | Status: AC
  Filled 2017-12-21: qty 5

## 2017-12-21 MED ORDER — VANCOMYCIN HCL 1000 MG IV SOLR
INTRAVENOUS | Status: AC
  Filled 2017-12-21: qty 1000

## 2017-12-21 MED ORDER — PHENYLEPHRINE 40 MCG/ML (10ML) SYRINGE FOR IV PUSH (FOR BLOOD PRESSURE SUPPORT)
PREFILLED_SYRINGE | INTRAVENOUS | Status: DC | PRN
  Administered 2017-12-21: 40 ug via INTRAVENOUS
  Administered 2017-12-21 (×2): 80 ug via INTRAVENOUS
  Administered 2017-12-21 (×2): 40 ug via INTRAVENOUS

## 2017-12-21 MED ORDER — ROCURONIUM BROMIDE 10 MG/ML (PF) SYRINGE
PREFILLED_SYRINGE | INTRAVENOUS | Status: DC | PRN
  Administered 2017-12-21: 30 mg via INTRAVENOUS

## 2017-12-21 MED ORDER — SCOPOLAMINE 1 MG/3DAYS TD PT72
MEDICATED_PATCH | TRANSDERMAL | Status: DC | PRN
  Administered 2017-12-21: 1 via TRANSDERMAL

## 2017-12-21 MED ORDER — HYDROMORPHONE HCL 1 MG/ML IJ SOLN
0.2500 mg | INTRAMUSCULAR | Status: DC | PRN
  Administered 2017-12-21 (×2): 0.5 mg via INTRAVENOUS

## 2017-12-21 MED ORDER — LIDOCAINE 2% (20 MG/ML) 5 ML SYRINGE
INTRAMUSCULAR | Status: DC | PRN
  Administered 2017-12-21: 100 mg via INTRAVENOUS

## 2017-12-21 MED ORDER — PROPOFOL 10 MG/ML IV BOLUS
INTRAVENOUS | Status: DC | PRN
  Administered 2017-12-21: 150 mg via INTRAVENOUS

## 2017-12-21 MED ORDER — SCOPOLAMINE 1 MG/3DAYS TD PT72
MEDICATED_PATCH | TRANSDERMAL | Status: AC
  Filled 2017-12-21: qty 1

## 2017-12-21 MED ORDER — LACTATED RINGERS IV SOLN
INTRAVENOUS | Status: DC | PRN
  Administered 2017-12-21 (×2): via INTRAVENOUS

## 2017-12-21 MED ORDER — ONDANSETRON HCL 4 MG/2ML IJ SOLN
INTRAMUSCULAR | Status: DC | PRN
  Administered 2017-12-21: 4 mg via INTRAVENOUS

## 2017-12-21 MED ORDER — DEXAMETHASONE SODIUM PHOSPHATE 10 MG/ML IJ SOLN
INTRAMUSCULAR | Status: AC
  Filled 2017-12-21: qty 1

## 2017-12-21 MED ORDER — CEFAZOLIN SODIUM-DEXTROSE 2-4 GM/100ML-% IV SOLN
2.0000 g | Freq: Three times a day (TID) | INTRAVENOUS | Status: AC
  Administered 2017-12-21 – 2017-12-22 (×3): 2 g via INTRAVENOUS
  Filled 2017-12-21 (×3): qty 100

## 2017-12-21 MED ORDER — VANCOMYCIN HCL 1000 MG IV SOLR
INTRAVENOUS | Status: DC | PRN
  Administered 2017-12-21: 1000 mg via TOPICAL

## 2017-12-21 MED ORDER — PHENYLEPHRINE 40 MCG/ML (10ML) SYRINGE FOR IV PUSH (FOR BLOOD PRESSURE SUPPORT)
PREFILLED_SYRINGE | INTRAVENOUS | Status: AC
  Filled 2017-12-21: qty 10

## 2017-12-21 MED ORDER — MIDAZOLAM HCL 2 MG/2ML IJ SOLN
INTRAMUSCULAR | Status: DC | PRN
  Administered 2017-12-21: 2 mg via INTRAVENOUS

## 2017-12-21 MED ORDER — ROCURONIUM BROMIDE 10 MG/ML (PF) SYRINGE
PREFILLED_SYRINGE | INTRAVENOUS | Status: AC
  Filled 2017-12-21: qty 10

## 2017-12-21 MED ORDER — PROPOFOL 10 MG/ML IV BOLUS
INTRAVENOUS | Status: AC
  Filled 2017-12-21: qty 40

## 2017-12-21 MED ORDER — FENTANYL CITRATE (PF) 100 MCG/2ML IJ SOLN
INTRAMUSCULAR | Status: DC | PRN
  Administered 2017-12-21: 50 ug via INTRAVENOUS
  Administered 2017-12-21 (×2): 25 ug via INTRAVENOUS
  Administered 2017-12-21: 50 ug via INTRAVENOUS

## 2017-12-21 MED ORDER — CEFAZOLIN SODIUM-DEXTROSE 2-4 GM/100ML-% IV SOLN
INTRAVENOUS | Status: AC
  Filled 2017-12-21: qty 100

## 2017-12-21 MED ORDER — SUCCINYLCHOLINE CHLORIDE 200 MG/10ML IV SOSY
PREFILLED_SYRINGE | INTRAVENOUS | Status: DC | PRN
  Administered 2017-12-21: 140 mg via INTRAVENOUS

## 2017-12-21 MED ORDER — MEPERIDINE HCL 50 MG/ML IJ SOLN: 6.2500 mg | INTRAMUSCULAR | Status: DC | PRN

## 2017-12-21 MED ORDER — PROMETHAZINE HCL 25 MG/ML IJ SOLN: 6.2500 mg | INTRAMUSCULAR | Status: DC | PRN

## 2017-12-21 MED ORDER — LIDOCAINE 2% (20 MG/ML) 5 ML SYRINGE
INTRAMUSCULAR | Status: AC
  Filled 2017-12-21: qty 5

## 2017-12-21 MED ORDER — MIDAZOLAM HCL 2 MG/2ML IJ SOLN
INTRAMUSCULAR | Status: AC
  Filled 2017-12-21: qty 2

## 2017-12-21 MED ORDER — ENOXAPARIN SODIUM 40 MG/0.4ML ~~LOC~~ SOLN
40.0000 mg | SUBCUTANEOUS | Status: DC
  Administered 2017-12-21 – 2017-12-23 (×3): 40 mg via SUBCUTANEOUS
  Filled 2017-12-21 (×3): qty 0.4

## 2017-12-21 MED ORDER — 0.9 % SODIUM CHLORIDE (POUR BTL) OPTIME
TOPICAL | Status: DC | PRN
  Administered 2017-12-21: 1000 mL

## 2017-12-21 MED ORDER — CEFAZOLIN SODIUM-DEXTROSE 2-4 GM/100ML-% IV SOLN
2.0000 g | INTRAVENOUS | Status: AC
  Administered 2017-12-21: 2 g via INTRAVENOUS

## 2017-12-21 SURGICAL SUPPLY — 55 items
BANDAGE ACE 4X5 VEL STRL LF (GAUZE/BANDAGES/DRESSINGS) ×3 IMPLANT
BANDAGE ACE 6X5 VEL STRL LF (GAUZE/BANDAGES/DRESSINGS) IMPLANT
BANDAGE ELASTIC 6 VELCRO ST LF (GAUZE/BANDAGES/DRESSINGS) ×3 IMPLANT
BIT DRILL SHORT 4.2 (BIT) ×2 IMPLANT
BLADE SURG 10 STRL SS (BLADE) ×6 IMPLANT
BNDG COHESIVE 4X5 TAN STRL (GAUZE/BANDAGES/DRESSINGS) ×3 IMPLANT
BNDG COHESIVE 6X5 TAN STRL LF (GAUZE/BANDAGES/DRESSINGS) IMPLANT
BRUSH SCRUB SURG 4.25 DISP (MISCELLANEOUS) ×6 IMPLANT
CHLORAPREP W/TINT 26ML (MISCELLANEOUS) ×3 IMPLANT
COVER SURGICAL LIGHT HANDLE (MISCELLANEOUS) ×3 IMPLANT
DRAPE C-ARM 35X43 STRL (DRAPES) ×3 IMPLANT
DRAPE C-ARMOR (DRAPES) ×3 IMPLANT
DRAPE HALF SHEET 40X57 (DRAPES) ×6 IMPLANT
DRAPE IMP U-DRAPE 54X76 (DRAPES) ×6 IMPLANT
DRAPE INCISE IOBAN 66X45 STRL (DRAPES) ×3 IMPLANT
DRAPE ORTHO SPLIT 77X108 STRL (DRAPES) ×4
DRAPE SURG 17X23 STRL (DRAPES) ×3 IMPLANT
DRAPE SURG ORHT 6 SPLT 77X108 (DRAPES) ×2 IMPLANT
DRAPE U-SHAPE 47X51 STRL (DRAPES) ×3 IMPLANT
DRILL BIT SHORT 4.2 (BIT) ×4
DRSG MEPILEX BORDER 4X4 (GAUZE/BANDAGES/DRESSINGS) ×3 IMPLANT
DRSG MEPILEX BORDER 4X8 (GAUZE/BANDAGES/DRESSINGS) ×3 IMPLANT
ELECT REM PT RETURN 9FT ADLT (ELECTROSURGICAL) ×3
ELECTRODE REM PT RTRN 9FT ADLT (ELECTROSURGICAL) ×1 IMPLANT
GLOVE BIO SURGEON STRL SZ7.5 (GLOVE) ×9 IMPLANT
GLOVE BIOGEL PI IND STRL 7.5 (GLOVE) ×1 IMPLANT
GLOVE BIOGEL PI INDICATOR 7.5 (GLOVE) ×2
GOWN STRL REUS W/ TWL LRG LVL3 (GOWN DISPOSABLE) ×3 IMPLANT
GOWN STRL REUS W/TWL LRG LVL3 (GOWN DISPOSABLE) ×6
GOWN STRL REUS W/TWL XL LVL3 (GOWN DISPOSABLE) ×3 IMPLANT
GUIDEWIRE 3.2X400 (WIRE) ×6 IMPLANT
KIT BASIN OR (CUSTOM PROCEDURE TRAY) ×3 IMPLANT
KIT TURNOVER KIT B (KITS) ×3 IMPLANT
MANIFOLD NEPTUNE II (INSTRUMENTS) ×3 IMPLANT
NAIL TI CANN RT 10 340 (Nail) ×3 IMPLANT
NS IRRIG 1000ML POUR BTL (IV SOLUTION) ×3 IMPLANT
PACK GENERAL/GYN (CUSTOM PROCEDURE TRAY) ×3 IMPLANT
PAD ARMBOARD 7.5X6 YLW CONV (MISCELLANEOUS) ×6 IMPLANT
REAMER ROD DEEP FLUTE 2.5X950 (INSTRUMENTS) ×3 IMPLANT
SCREW LOCKING 5.0MMX40MM (Screw) ×3 IMPLANT
SCREW LOCKING 5.0MMX44MM (Screw) ×3 IMPLANT
SCREW RECON 6.5 X 85MM (Screw) ×3 IMPLANT
SCREW RECON TI T25 6.5X80 (Screw) ×3 IMPLANT
STAPLER VISISTAT 35W (STAPLE) ×3 IMPLANT
STOCKINETTE IMPERVIOUS LG (DRAPES) ×3 IMPLANT
SUT ETHILON 3 0 PS 1 (SUTURE) ×3 IMPLANT
SUT MNCRL AB 3-0 PS2 18 (SUTURE) IMPLANT
SUT VIC AB 0 CT1 27 (SUTURE) ×2
SUT VIC AB 0 CT1 27XBRD ANBCTR (SUTURE) ×1 IMPLANT
SUT VIC AB 2-0 CT1 27 (SUTURE) ×2
SUT VIC AB 2-0 CT1 TAPERPNT 27 (SUTURE) ×1 IMPLANT
TOWEL OR 17X24 6PK STRL BLUE (TOWEL DISPOSABLE) ×3 IMPLANT
TOWEL OR 17X26 10 PK STRL BLUE (TOWEL DISPOSABLE) ×6 IMPLANT
UNDERPAD 30X30 (UNDERPADS AND DIAPERS) ×3 IMPLANT
WATER STERILE IRR 1000ML POUR (IV SOLUTION) IMPLANT

## 2017-12-21 NOTE — Progress Notes (Signed)
Orthopedic Tech Progress Note Patient Details:  Nichole Mcmahon 09-16-1970 [id number]  Musculoskeletal Traction Type of Traction: Bucks Skin Traction Traction Location: rle Traction Weight: 15 lbs   Post Interventions Patient Tolerated: Well Instructions Provided: Care of device, Adjustment of device   [name] 12/21/2017, 4:54 AM

## 2017-12-21 NOTE — Op Note (Signed)
OrthopaedicSurgeryOperativeNote (PRF:[id number]) Date of Surgery: 12/21/2017  Admit Date: 12/20/2017   Diagnoses: Pre-Op Diagnoses: Right subtrochanteric femur fracture  Post-Op Diagnosis: Same  Procedures: CPT 27506-Intramedullary nailing of right femur fracture   Surgeons: Primary: Ramie Palladino, Thomasene Lot, MD   Location:[facility] OR ROOM 05   AnesthesiaGeneral   Antibiotics:Ancef 2g preop   Tourniquettime:None  [id number]:300 mL   Complications:None  Specimens:None  Implants: Implant Name Type Inv. Item Serial No. Manufacturer Lot No. LRB No. Used Action  36mm/ti cann frn/ pf 355mm right sterile    DEPUY SYNTHES [id number] Right 1 Implanted  SCREW RECON 6.5 X 85MM - [id number] Screw SCREW RECON 6.5 X 85MM  [employer] SPINE [id number] Right 1 Implanted  SCREW RECON TI T25 6.5X80 - [id number] Screw SCREW RECON TI T25 6.5X80  SYNTHES TRAUMA [id number] Right 1 Implanted  SCREW LOCKING 5.0MMX44MM - [id number] Screw SCREW LOCKING 5.0MMX44MM  SYNTHES TRAUMA [id number] Right 1 Implanted  SCREW LOCKING 5.0MMX40MM - [id number] Screw SCREW LOCKING 5.0MMX40MM  SYNTHES TRAUMA [id number] Right 1 Implanted    IndicationsforSurgery: 47 year old female who fell while in the shower and had immediate pain and deformity.  Brought to emergency room where x-rays showed a spiral oblique right subtrochanteric femur fracture.  I was consulted for treatment.  I felt that proceeding with intramedullary fixation of her fracture was most appropriate.  Discussed risks and benefits with the patient. Risks discussed included bleeding requiring blood transfusion, bleeding causing a hematoma, infection, malunion, nonunion, damage to surrounding nerves and blood vessels, pain, hardware prominence or irritation, hardware failure, stiffness, post-traumatic arthritis, DVT/PE, compartment syndrome, and even death.  Patient agreed to proceed with surgery and consent was obtained.  Operative Findings: Intramedullary  nailing of right subtrochanteric femur fracture using a [employer] FRN piriformis entry recon nail size 10x373mm with 2 recon screws and 2 distal interlocking screws.  Procedure: The patient was identified in the preoperative holding area. Consent was confirmed with the patient and the family and all questions were answered. The operative extremity was marked and the patient was then brought back to the operating room by our anesthesia colleagues. She was carefully transferred to a radiolucent flat-top table. A bump was placed and secured under the operative extremity. The operative extremity was then prepped and draped in sterile fashion. A pre incision timeout was performed to verify the patient, the procedure and the extremity. Preoperative antibiotics were dosed.   The hip and knee were flexed over a triangle. AP and lateral view were obtained to identify a correct incision and the skin and subcutaneous fat were incised above the greater trochanter. A curved mayo scissors was used to spread inline with the hip abductors to the piriformis fossa. A threaded guidepin was used to identify the correct starting point. AP and lateral views were used to advance the pin to just below the lesser trochanter. A entry reamer was used to enter the canal and the guidepin and reamer were removed.   A bent ball-tip guidewire was sent done the canal. A reduction maneuver was performed with traction and internal rotation of the distal segment.  Unfortunately the proximal fragment was still flexed up significantly causing a deformity when the guidewire was passed.  Has resolved I made a incision over the lateral aspect of the femur split the IT band and then used a Cobb elevator to extend the proximal fragment to improve the alignment during reaming.. The ball-tip guidewire was eventually passed in the distal segment. AP and lateral fluoro was used to make sure  the path of the guidewire was center-center in the distal part of  the femur. This was seated down into the physeal scar. The length of the nail was measured and we chose a 360 mm nail. The fracture was held reduced and I sequentially reamed from a 8.21mm to 11.5 mm with adequate chatter at the end. The nail was then passed down the center of the canal.  The nail was seated until it was flush with the piriformis entry. The targeting device for recon screws.  Unfortunately the fragment was still flexed up with the nail in place as a result I extended my incision laterally and I used a large clamp to reduce the fragments together to correct the alignment on the lateral view.  Once I was pleased with reduction I then placed the guide pins into the left femoral neck and head.  And then drilled and placed 2 recon screws.  Once we had the proximal segment locked to the nail we focused on rotation and alignment.  The alignment was appropriate as well as well as the rotation from the reduction that I had with the forceps.  Using perfect circle technique, two distal interlocks were placed in the nail.  The insertion handle was removed an final x-rays were obtained. The incisions were then irrigated and closed with 2-0 vicryl and 3-0 monocryl. This incisions were sealed with dermabond. The drapes were broken down. Rotation was checked clinically compared to the contralateral side and was nearly identical. The knee was examined and there was no instability about the knee. The patient was then awoken from anesthesia, transferred to the floor bed and taken to the PACU in stable condition.  Post Op Plan/Instructions: Patient will be weightbearing as tolerated to the right lower extremity.  She will receive postoperative Ancef.  She will receive Lovenox in the hospital would likely discharge with aspirin 325 mg twice daily.   I was present and performed the entire surgery.  Katha Hamming, MD [facility]

## 2017-12-21 NOTE — Plan of Care (Signed)

## 2017-12-21 NOTE — Plan of Care (Signed)
  Problem: Education: Goal: Knowledge of General Education information will improve Description Including pain rating scale, medication(s)/side effects and non-pharmacologic comfort measures Outcome: Progressing   Problem: Activity: Goal: Risk for activity intolerance will decrease Outcome: Not Progressing Pt having a hard time moving leg to pain and fear of pain   Problem: Pain Managment: Goal: General experience of comfort will improve Outcome: Progressing

## 2017-12-21 NOTE — Interval H&P Note (Signed)
History and Physical Interval Note:  12/21/2017 7:05 AM  Nichole Mcmahon  has presented today for surgery, with the diagnosis of Right subtrochanteric femur fracture  The various methods of treatment have been discussed with the patient and family. After consideration of risks, benefits and other options for treatment, the patient has consented to  Procedure(s): INTRAMEDULLARY (IM) NAIL FEMORAL (Right) as a surgical intervention .  The patient's history has been reviewed, patient examined, no change in status, stable for surgery.  I have reviewed the patient's chart and labs.  Questions were answered to the patient's satisfaction.     Lennette Bihari P Jakeira Seeman

## 2017-12-21 NOTE — Anesthesia Procedure Notes (Signed)
Procedure Name: Intubation Date/Time: 12/21/2017 7:35 AM Performed by: Leonor Liv, CRNA Pre-anesthesia Checklist: Patient identified, Emergency Drugs available, Suction available and Patient being monitored Patient Re-evaluated:Patient Re-evaluated prior to induction Oxygen Delivery Method: Circle System Utilized Preoxygenation: Pre-oxygenation with 100% oxygen Induction Type: IV induction, Rapid sequence and Cricoid Pressure applied Laryngoscope Size: Mac and 3 Grade View: Grade I Tube type: Oral Tube size: 7.0 mm Number of attempts: 1 Airway Equipment and Method: Stylet and Oral airway Placement Confirmation: ETT inserted through vocal cords under direct vision,  positive ETCO2 and breath sounds checked- equal and bilateral Secured at: 20 cm Tube secured with: Tape Dental Injury: Teeth and Oropharynx as per pre-operative assessment

## 2017-12-21 NOTE — Transfer of Care (Signed)
Immediate Anesthesia Transfer of Care Note  Patient: Nichole Mcmahon  Procedure(s) Performed: INTRAMEDULLARY (IM) NAIL FEMORAL (Right )  Patient Location: PACU  Anesthesia Type:General  Level of Consciousness: drowsy and patient cooperative  Airway & Oxygen Therapy: Patient Spontanous Breathing and Patient connected to nasal cannula oxygen  Post-op Assessment: Report given to RN and Post -op Vital signs reviewed and stable  Post vital signs: Reviewed and stable  Last Vitals:  Vitals Value Taken Time  BP 105/62 12/21/2017  9:50 AM  Temp    Pulse 94 12/21/2017  9:50 AM  Resp 14 12/21/2017  9:50 AM  SpO2 100 % 12/21/2017  9:50 AM  Vitals shown include unvalidated device data.  Last Pain:  Vitals:   12/21/17 0540  TempSrc:   PainSc: 7          Complications: No apparent anesthesia complications

## 2017-12-21 NOTE — ED Notes (Signed)
Op consent signed.  ?

## 2017-12-21 NOTE — Anesthesia Postprocedure Evaluation (Signed)
Anesthesia Post Note  Patient: Nichole Mcmahon  Procedure(s) Performed: INTRAMEDULLARY (IM) NAIL FEMORAL (Right )     Anesthesia Type: General Level of consciousness: awake Pain management: pain level controlled Vital Signs Assessment: post-procedure vital signs reviewed and stable Respiratory status: spontaneous breathing Cardiovascular status: stable Postop Assessment: no apparent nausea or vomiting Anesthetic complications: no    Last Vitals:  Vitals:   12/21/17 1134 12/21/17 1204  BP: (!) 103/58 (!) 103/59  Pulse: 78 81  Resp: (!) 7 (!) 9  Temp:    SpO2: 96% 97%    Last Pain:  Vitals:   12/21/17 1134  TempSrc:   PainSc: Asleep   Pain Goal: Patients Stated Pain Goal: 4 (12/21/17 1049)               [place]

## 2017-12-22 LAB — CBC
HCT: 22.6 % — ABNORMAL LOW (ref 36.0–46.0)
HEMATOCRIT: 20.8 % — AB (ref 36.0–46.0)
HEMOGLOBIN: 7.4 g/dL — AB (ref 12.0–15.0)
Hemoglobin: 6.8 g/dL — CL (ref 12.0–15.0)
MCH: 31 pg (ref 26.0–34.0)
MCH: 31.1 pg (ref 26.0–34.0)
MCHC: 32.7 g/dL (ref 30.0–36.0)
MCHC: 32.7 g/dL (ref 30.0–36.0)
MCV: 94.6 fL (ref 78.0–100.0)
MCV: 95 fL (ref 78.0–100.0)
PLATELETS: 225 10*3/uL (ref 150–400)
PLATELETS: 232 10*3/uL (ref 150–400)
RBC: 2.39 MIL/uL — AB (ref 3.87–5.11)
RBC: 2.19 MIL/uL — ABNORMAL LOW (ref 3.87–5.11)
RDW: 13 % (ref 11.5–15.5)
RDW: 13.4 % (ref 11.5–15.5)
WBC: 14.2 10*3/uL — ABNORMAL HIGH (ref 4.0–10.5)
WBC: 14.4 10*3/uL — ABNORMAL HIGH (ref 4.0–10.5)

## 2017-12-22 LAB — PREPARE RBC (CROSSMATCH)

## 2017-12-22 MED ORDER — SODIUM CHLORIDE 0.9% IV SOLUTION
Freq: Once | INTRAVENOUS | Status: AC
  Administered 2017-12-22: 16:00:00 via INTRAVENOUS

## 2017-12-22 NOTE — Plan of Care (Signed)

## 2017-12-22 NOTE — Progress Notes (Addendum)
Orthopaedic Trauma Progress Note  S: Doing well, pain has been well controlled. No major complaints. No lightheadedness, no shortness of breath or chest pain  O:  Vitals:   12/22/17 0020 12/22/17 0620  BP: (!) 108/58 (!) 108/58  Pulse: 82 97  Resp: 15 15  Temp: 100 F (37.8 C) 98.1 F (36.7 C)  SpO2: 100% 100%    GEN: NAD, AAOx3 RLE: Dressings clean, dry and intact. Compartments soft and compressible. Neurovascularly intact  Imaging: Stable postop imaging  Labs:  Results for orders placed or performed during the hospital encounter of 12/20/17 (from the past 24 hour(s))  CBC     Status: Abnormal   Collection Time: 12/22/17  3:26 AM  Result Value Ref Range   WBC 14.2 (H) 4.0 - 10.5 K/uL   RBC 2.39 (L) 3.87 - 5.11 MIL/uL   Hemoglobin 7.4 (L) 12.0 - 15.0 g/dL   HCT 22.6 (L) 36.0 - 46.0 %   MCV 94.6 78.0 - 100.0 fL   MCH 31.0 26.0 - 34.0 pg   MCHC 32.7 30.0 - 36.0 g/dL   RDW 13.0 11.5 - 15.5 %   Platelets 225 150 - 400 K/uL    Assessment: 47yo female s/p fall  Injuries: Right subtrochanteric femur fracture s/p IMN  Weightbearing: WBAT RLE  Insicional and dressing care: Dressings to remain in place until POD2  Orthopedic device(s):None needed  CV/Blood loss: Hgb 7.4 this AM,acute blood loss anemia hemodynamically stable for now, will repeat labs at [facility] if below 7 will transfuse or if patient is symptomatic  Pain management: 1. Percocet 5/325mg  1-2 tabs q 4-6 hours 2. Morphine 2mg  q 2hr PRN IV 3. Robaxin 500 mg q 6hr PRN  VTE prophylaxis: Lovenox  ID: Ancef for 24 hours postoperatively  Foley/Lines: KVO  Medical co-morbidities: None  Impediments to Fracture Healing: None  Dispo: PT/OT eval pending, likely home with HH PT  Follow - up plan: 2 weeks   Shona Needles, MD [facility] [phone] (phone)

## 2017-12-22 NOTE — Evaluation (Signed)
Occupational Therapy Evaluation Patient Details Name: Nichole Mcmahon MRN: [id number] DOB: 1971-05-01 Today's Date: 12/22/2017    History of Present Illness Pt is a 47 y/o female s/p IM nail R femur after sustaining a fall in her shower. Pt also with R sprained wrist. PMH including but not limited to depression and HLD.   Clinical Impression   Pt with decline in function and safety with ADLs and ADL mobility with decreased balance, endurance, R UE function/pain. Pt up in recliner upon arrival with husband present. Pt transferred to [facility] with mod A and required max A for toileting tasks, max A with LB ADLs. Pt with R wrist sprain and WBAT, however painful when she attempts to use it for functional tasks. Pt would benefit from acute OT services to address impairments to maximize level of function and safety.    Follow Up Recommendations  Home health OT    Equipment Recommendations  Tub/shower seat;Other (comment)(ADL A/E)    Recommendations for Other Services       Precautions / Restrictions Precautions Precautions: Fall Restrictions Weight Bearing Restrictions: Yes RLE Weight Bearing: Weight bearing as tolerated      Mobility Bed Mobility Overal bed mobility: Needs Assistance Bed Mobility: Sit to Supine     Supine to sit: Mod assist;+2 for physical assistance Sit to supine: Max assist   General bed mobility comments: pt in recliner upon arrival. Max A with LEs back onto bed  Transfers Overall transfer level: Needs assistance Equipment used: Rolling walker (2 wheeled) Transfers: Sit to/from [facility] Sit to Stand: Mod assist Stand pivot transfers: Mod assist       General transfer comment: increased time and effort, cueing for technique, assist to power into standing from EOB and to pivot to chair towards her L side    Balance Overall balance assessment: Needs assistance Sitting-balance support: Feet supported Sitting balance-Leahy Scale: Fair      Standing balance support: During functional activity;Bilateral upper extremity supported Standing balance-Leahy Scale: Poor Standing balance comment: mod A x2                           ADL either performed or assessed with clinical judgement   ADL Overall ADL's : Needs assistance/impaired Eating/Feeding: Set up;Sitting   Grooming: Wash/dry hands;Wash/dry face;Sitting;Min guard   Upper Body Bathing: Minimal assistance   Lower Body Bathing: Maximal assistance   Upper Body Dressing : Minimal assistance   Lower Body Dressing: Maximal assistance   Toilet Transfer: Moderate assistance;Stand-pivot;Cueing for sequencing;Cueing for safety;RW;BSC   Toileting- Clothing Manipulation and Hygiene: Maximal assistance;Sit to/from stand;Sitting/lateral lean       Functional mobility during ADLs: Moderate assistance;Cueing for sequencing;Cueing for safety;Rolling walker General ADL Comments: pt edcuated on ADL A/E for home use with handout provided     Vision Patient Visual Report: No change from baseline       Perception     Praxis      Pertinent Vitals/Pain Pain Assessment: 0-10 Pain Score: 6  Faces Pain Scale: Hurts whole lot Pain Location: R hip Pain Descriptors / Indicators: Sore;Grimacing;Guarding Pain Intervention(s): Limited activity within patient's tolerance;Monitored during session;Premedicated before session;Repositioned     Hand Dominance Right   Extremity/Trunk Assessment Upper Extremity Assessment Upper Extremity Assessment: Overall WFL for tasks assessed   Lower Extremity Assessment Lower Extremity Assessment: Defer to PT evaluation RLE Deficits / Details: pt with decreased strength and ROM limitations secondary to post-op pain and weaknes. Pt  with sensation grossly intact throughout.   Cervical / Trunk Assessment Cervical / Trunk Assessment: Normal   Communication Communication Communication: No difficulties   Cognition Arousal/Alertness:  Awake/alert Behavior During Therapy: WFL for tasks assessed/performed Overall Cognitive Status: Within Functional Limits for tasks assessed                                     General Comments       Exercises Exercises: Other exercises;General Lower Extremity General Exercises - Lower Extremity Ankle Circles/Pumps: AROM;Both;10 reps;Supine Other Exercises Other Exercises: attempted to perform heel slides and SLR on R LE; however, pt unable to tolerate even with assist   Shoulder Instructions      Home Living Family/patient expects to be discharged to:: Private residence Living Arrangements: Spouse/significant other;Children Available Help at Discharge: Family;Available 24 hours/day Type of Home: House Home Access: Stairs to enter [employer] of Steps: 5 Entrance Stairs-Rails: Right;Left Home Layout: One level     Bathroom Shower/Tub: Walk-in shower;Tub/shower unit   Bathroom Toilet: Standard     Home Equipment: None          Prior Functioning/Environment Level of Independence: Independent                 OT Problem List: Decreased strength;Decreased activity tolerance;Impaired UE functional use;Decreased knowledge of use of DME or AE;Decreased range of motion;Impaired balance (sitting and/or standing);Decreased coordination;Pain      OT Treatment/Interventions: Self-care/ADL training;Therapeutic exercise;DME and/or AE instruction;Therapeutic activities;Patient/family education    OT Goals(Current goals can be found in the care plan section) Acute Rehab OT Goals Patient Stated Goal: decrease pain OT Goal Formulation: With patient/family Time For Goal Achievement: 01/05/18 Potential to Achieve Goals: Good ADL Goals Pt Will Perform Grooming: with set-up;sitting;with caregiver independent in assisting Pt Will Perform Upper Body Bathing: with min guard assist;with supervision;with set-up;sitting;with caregiver independent in  assisting Pt Will Perform Lower Body Bathing: with mod assist;with adaptive equipment;sitting/lateral leans;sit to/from stand;with caregiver independent in assisting Pt Will Perform Upper Body Dressing: with min guard assist;with supervision;with set-up;sitting;with caregiver independent in assisting Pt Will Perform Lower Body Dressing: with mod assist;with adaptive equipment;with caregiver independent in assisting Pt Will Transfer to Toilet: with min assist;stand pivot transfer;ambulating Pt Will Perform Toileting - Clothing Manipulation and hygiene: with mod assist;sitting/lateral leans;sit to/from stand;with caregiver independent in assisting Pt Will Perform Tub/Shower Transfer: with min assist;shower seat;ambulating;with caregiver independent in assisting  OT Frequency: Min 2X/week   Barriers to D/C:    no barriers       Co-evaluation              AM-PAC PT "6 Clicks" Daily Activity     Outcome Measure Help from another person eating meals?: None Help from another person taking care of personal grooming?: A Little Help from another person toileting, which includes using toliet, bedpan, or urinal?: A Lot Help from another person bathing (including washing, rinsing, drying)?: A Lot Help from another person to put on and taking off regular upper body clothing?: A Little Help from another person to put on and taking off regular lower body clothing?: Total 6 Click Score: 15   End of Session Equipment Utilized During Treatment: Gait belt;Rolling walker;Other (comment)(BSC)  Activity Tolerance: Patient tolerated treatment well Patient left: in bed;with call bell/phone within reach;with bed alarm set;with family/visitor present  OT Visit Diagnosis: Unsteadiness on feet (R26.81);Other abnormalities of gait and mobility (R26.89);Pain;History of  falling (Z91.81) Pain - Right/Left: Right Pain - part of body: Arm;Hip;Leg                Time: 7618-4859 OT Time Calculation (min): 24  min Charges:  OT General Charges $OT Visit: 1 Visit OT Evaluation $OT Eval Moderate Complexity: 1 Mod   Britt Bottom 12/22/2017, 1:06 PM

## 2017-12-22 NOTE — Evaluation (Addendum)
Physical Therapy Evaluation Patient Details Name: Nichole Mcmahon MRN: [id number] DOB: 1970-08-18 Today's Date: 12/22/2017   History of Present Illness  Pt is a 47 y/o female s/p IM nail R femur after sustaining a fall in her shower. Pt also with R sprained wrist. PMH including but not limited to depression and HLD.    Clinical Impression  Pt presented supine in bed with HOB elevated, awake and willing to participate in therapy session. Prior to admission, pt reported that she was independent with all functional mobility and ADLs. Pt lives with her husband and son in a single level house with five steps to enter. She stated that she would have 24/7 supervision/assistance upon d/c. Pt currently requires mod A x2 for bed mobility and transfers. Pt would greatly benefit from further therapy services and transfer training with family prior to d/c home. PT will continue to follow pt acutely to progress mobility as tolerated.  Patient is s/p IM nail R femur which impairs their ability to perform daily activities in the home. A walker alone will not resolve the issues with performing activities of daily living. A wheelchair will allow patient to safely perform daily activities. The patient can self propel in the home or has a caregiver who can provide assistance.     Follow Up Recommendations Home health PT;Supervision/Assistance - 24 hour    Equipment Recommendations  Wheelchair (measurements PT);Wheelchair cushion (measurements PT);Other (comment)(w/c with elevating leg rests)    Recommendations for Other Services       Precautions / Restrictions Precautions Precautions: Fall Restrictions Weight Bearing Restrictions: Yes RLE Weight Bearing: Weight bearing as tolerated      Mobility  Bed Mobility Overal bed mobility: Needs Assistance Bed Mobility: Supine to Sit     Supine to sit: Mod assist;+2 for physical assistance     General bed mobility comments: increased time and effort,  assist with R LE movement and for trunk elevation  Transfers Overall transfer level: Needs assistance Equipment used: 2 person hand held assist Transfers: Sit to/from [employer] Transfers Sit to Stand: Mod assist;+2 physical assistance;+2 safety/equipment Stand pivot transfers: Mod assist;+2 physical assistance;+2 safety/equipment       General transfer comment: increased time and effort, cueing for technique, assist to power into standing from EOB and to pivot to chair towards her L side  Ambulation/Gait             General Gait Details: pt unable to tolerate this session; will attempt next session if appropriate  Stairs            Wheelchair Mobility    Modified Rankin (Stroke Patients Only)       Balance Overall balance assessment: Needs assistance Sitting-balance support: Feet supported Sitting balance-Leahy Scale: Fair     Standing balance support: During functional activity;Bilateral upper extremity supported Standing balance-Leahy Scale: Poor Standing balance comment: mod A x2                             Pertinent Vitals/Pain Pain Assessment: Faces Faces Pain Scale: Hurts whole lot Pain Location: R hip Pain Descriptors / Indicators: Sore;Grimacing;Guarding Pain Intervention(s): Monitored during session;Repositioned    Home Living Family/patient expects to be discharged to:: Private residence Living Arrangements: Spouse/significant other;Children Available Help at Discharge: Family;Available 24 hours/day Type of Home: House Home Access: Stairs to enter Entrance Stairs-Rails: Psychiatric nurse of Steps: 5 Home Layout: One level Home Equipment: None  Prior Function Level of Independence: Independent               Hand Dominance        Extremity/Trunk Assessment   Upper Extremity Assessment Upper Extremity Assessment: Defer to OT evaluation    Lower Extremity Assessment Lower Extremity  Assessment: RLE deficits/detail RLE Deficits / Details: pt with decreased strength and ROM limitations secondary to post-op pain and weaknes. Pt with sensation grossly intact throughout.    Cervical / Trunk Assessment Cervical / Trunk Assessment: Normal  Communication   Communication: No difficulties  Cognition Arousal/Alertness: Awake/alert Behavior During Therapy: WFL for tasks assessed/performed Overall Cognitive Status: Within Functional Limits for tasks assessed                                        General Comments      Exercises General Exercises - Lower Extremity Ankle Circles/Pumps: AROM;Both;10 reps;Supine Other Exercises Other Exercises: attempted to perform heel slides and SLR on R LE; however, pt unable to tolerate even with assist   Assessment/Plan    PT Assessment Patient needs continued PT services  PT Problem List Decreased strength;Decreased range of motion;Decreased activity tolerance;Decreased balance;Decreased mobility;Decreased coordination;Decreased knowledge of use of DME;Decreased safety awareness;Decreased knowledge of precautions;Pain       PT Treatment Interventions DME instruction;Gait training;Stair training;Functional mobility training;Therapeutic activities;Therapeutic exercise;Balance training;Neuromuscular re-education;Patient/family education    PT Goals (Current goals can be found in the Care Plan section)  Acute Rehab PT Goals Patient Stated Goal: decrease pain PT Goal Formulation: With patient Time For Goal Achievement: 01/05/18 Potential to Achieve Goals: Good    Frequency Min 3X/week   Barriers to discharge        Co-evaluation               AM-PAC PT "6 Clicks" Daily Activity  Outcome Measure Difficulty turning over in bed (including adjusting bedclothes, sheets and blankets)?: Unable Difficulty moving from lying on back to sitting on the side of the bed? : Unable Difficulty sitting down on and standing  up from a chair with arms (e.g., wheelchair, bedside commode, etc,.)?: Unable Help needed moving to and from a bed to chair (including a wheelchair)?: A Lot Help needed walking in hospital room?: A Lot Help needed climbing 3-5 steps with a railing? : Total 6 Click Score: 8    End of Session Equipment Utilized During Treatment: Gait belt Activity Tolerance: Patient limited by pain Patient left: in chair;with call bell/phone within reach;with chair alarm set Nurse Communication: Mobility status PT Visit Diagnosis: Other abnormalities of gait and mobility (R26.89);Pain Pain - Right/Left: Right Pain - part of body: Hip;Arm    Time: 0981-1914 PT Time Calculation (min) (ACUTE ONLY): 23 min   Charges:   PT Evaluation $PT Eval Moderate Complexity: 1 Mod PT Treatments $Therapeutic Activity: 8-22 mins        Mather, [place], DPT [place] 12/22/2017, 11:09 AM

## 2017-12-23 LAB — BPAM RBC
BLOOD PRODUCT EXPIRATION DATE: 201909072359
ISSUE DATE / TIME: 201908091543
Unit Type and Rh: 5100

## 2017-12-23 LAB — TYPE AND SCREEN
ABO/RH(D): O POS
ANTIBODY SCREEN: NEGATIVE
UNIT DIVISION: 0

## 2017-12-23 LAB — CBC
HEMATOCRIT: 25 % — AB (ref 36.0–46.0)
Hemoglobin: 8.3 g/dL — ABNORMAL LOW (ref 12.0–15.0)
MCH: 31 pg (ref 26.0–34.0)
MCHC: 33.2 g/dL (ref 30.0–36.0)
MCV: 93.3 fL (ref 78.0–100.0)
Platelets: 207 10*3/uL (ref 150–400)
RBC: 2.68 MIL/uL — ABNORMAL LOW (ref 3.87–5.11)
RDW: 14.5 % (ref 11.5–15.5)
WBC: 11.6 10*3/uL — AB (ref 4.0–10.5)

## 2017-12-23 MED ORDER — ASPIRIN EC 325 MG PO TBEC: 325.0000 mg | DELAYED_RELEASE_TABLET | Freq: Two times a day (BID) | ORAL | 0 refills | Status: AC

## 2017-12-23 MED ORDER — METHOCARBAMOL 750 MG PO TABS: 750.0000 mg | ORAL_TABLET | Freq: Four times a day (QID) | ORAL | 0 refills | Status: DC | PRN

## 2017-12-23 MED ORDER — OXYCODONE-ACETAMINOPHEN 5-325 MG PO TABS: 1.0000 | ORAL_TABLET | ORAL | 0 refills | Status: DC | PRN

## 2017-12-23 NOTE — Discharge Instructions (Signed)
[facility]thopaedic [facility]uma [facility] Discharge Instructions   General Discharge Instructions  WEIGHT BEARING STATUS:Weight bearing as tolerated to right leg  RANGE OF MOTION/ACTIVITY: No restrictions  Wound Care: May shower and leave incisions open to air  DVT/PE prophylaxis:Take 325mg  aspirin twice daily for 30 days  Diet: as you were eating previously.  Can use over the counter stool softeners and bowel preparations, such as Miralax, to help with bowel movements.  Narcotics can be constipating.  Be sure to drink plenty of fluids  PAIN MEDICATION USE AND EXPECTATIONS  You have likely been given narcotic medications to help control your pain.  After a traumatic event that results in an fracture (broken bone) with or without surgery, it is ok to use narcotic pain medications to help control one's pain.  We understand that everyone responds to pain differently and each individual patient will be evaluated on a regular basis for the continued need for narcotic medications. Ideally, narcotic medication use should last no more than 6-8 weeks (coinciding with fracture healing).   As a patient it is your responsibility as well to monitor narcotic medication use and report the amount and frequency you use these medications when you come to your office visit.   We would also advise that if you are using narcotic medications, you should take a dose prior to therapy to maximize you participation.  IF YOU ARE ON NARCOTIC MEDICATIONS IT IS NOT PERMISSIBLE TO OPERATE A MOTOR VEHICLE (MOTORCYCLE/CAR/TRUCK/MOPED) OR HEAVY MACHINERY DO NOT MIX NARCOTICS WITH OTHER CNS (CENTRAL NERVOUS SYSTEM) DEPRESSANTS SUCH AS ALCOHOL   STOP SMOKING OR USING NICOTINE PRODUCTS!!!!  As discussed nicotine severely impairs your body's ability to heal surgical and traumatic wounds but also impairs bone healing.  Wounds and bone heal by forming microscopic blood vessels (angiogenesis) and nicotine is a vasoconstrictor (essentially,  shrinks blood vessels).  Therefore, if vasoconstriction occurs to these microscopic blood vessels they essentially disappear and are unable to deliver necessary nutrients to the healing tissue.  This is one modifiable factor that you can do to dramatically increase your chances of healing your injury.    (This means no smoking, no nicotine gum, patches, etc)  DO NOT USE NONSTEROIDAL ANTI-INFLAMMATORY DRUGS (NSAID'S)  Using products such as Advil (ibuprofen), Aleve (naproxen), Motrin (ibuprofen) for additional pain control during fracture healing can delay and/or prevent the healing response.  If you would like to take over the counter (OTC) medication, Tylenol (acetaminophen) is ok.  However, some narcotic medications that are given for pain control contain acetaminophen as well. Therefore, you should not exceed more than 4000 mg of tylenol in a day if you do not have liver disease.  Also note that there are may OTC medicines, such as cold medicines and allergy medicines that my contain tylenol as well.  If you have any questions about medications and/or interactions please ask your doctor/PA or your pharmacist.      ICE AND ELEVATE INJURED/OPERATIVE EXTREMITY  Using ice and elevating the injured extremity above your heart can help with swelling and pain control.  Icing in a pulsatile fashion, such as 20 minutes on and 20 minutes off, can be followed.    Do not place ice directly on skin. Make sure there is a barrier between to skin and the ice pack.    Using frozen items such as frozen peas works well as the conform nicely to the are that needs to be iced.  USE AN ACE WRAP OR TED HOSE FOR SWELLING CONTROL  In addition to icing and elevation, Ace wraps or TED hose are used to help limit and resolve swelling.  It is recommended to use Ace wraps or TED hose until you are informed to stop.    When using Ace Wraps start the wrapping distally (farthest away from the body) and wrap proximally (closer to the  body)   Example: If you had surgery on your leg or thing and you do not have a splint on, start the ace wrap at the toes and work your way up to the thigh        If you had surgery on your upper extremity and do not have a splint on, start the ace wrap at your fingers and work your way up to the upper arm  IF YOU ARE IN A SPLINT OR CAST DO NOT [place]   If your splint gets wet for any reason please contact the office immediately. You may shower in your splint or cast as long as you keep it dry.  This can be done by wrapping in a cast cover or garbage back (or similar)  Do Not stick any thing down your splint or cast such as pencils, money, or hangers to try and scratch yourself with.  If you feel itchy take benadryl as prescribed on the bottle for itching  IF YOU ARE IN A CAM BOOT (BLACK BOOT)  You may remove boot periodically. Perform daily dressing changes as noted below.  Wash the liner of the boot regularly and wear a sock when wearing the boot. It is recommended that you sleep in the boot until told otherwise  CALL THE OFFICE WITH ANY QUESTIONS OR CONCERNS: [phone]

## 2017-12-23 NOTE — Progress Notes (Signed)
Occupational Therapy Treatment Patient Details Name: Nichole Mcmahon MRN: [id number] DOB: 08-18-1970 Today's Date: 12/23/2017    History of present illness Pt is a 47 y/o female s/p IM nail R femur after sustaining a fall in her shower. Pt also with R sprained wrist. PMH including but not limited to depression and HLD.   OT comments  Pt. Seen for skilled OT session. Completed bed mobility mod a x2 person assist and was able to ambulate to b.room mod a for toileting task.  Increased mobility from previous day, steady progress.  Will continue with tub transfer next session.   Follow Up Recommendations  Home health OT    Equipment Recommendations  Tub/shower seat;Other (comment)    Recommendations for Other Services      Precautions / Restrictions Precautions Precautions: Fall Restrictions RLE Weight Bearing: Weight bearing as tolerated       Mobility Bed Mobility Overal bed mobility: Needs Assistance Bed Mobility: Supine to Sit     Supine to sit: Mod assist;+2 for physical assistance        Transfers Overall transfer level: Needs assistance Equipment used: Rolling walker (2 wheeled) Transfers: Sit to/from [employer] Sit to Stand: Mod assist Stand pivot transfers: Mod assist       General transfer comment: increased time and effort, cueing for technique, assist to power into standing from EOB, also great difficulty noted stand/sit on bsc over commode, unable to scoot RLE forward before sitting    Balance                                           ADL either performed or assessed with clinical judgement   ADL Overall ADL's : Needs assistance/impaired                         Toilet Transfer: Moderate assistance;Cueing for sequencing;Cueing for safety;RW;BSC;Regular Toilet;Grab bars;Ambulation   Toileting- Clothing Manipulation and Hygiene: Maximal assistance;Sit to/from stand;Sitting/lateral lean       Functional  mobility during ADLs: Moderate assistance;Cueing for sequencing;Cueing for safety;Rolling walker General ADL Comments: fearful to initiate steps with rw. husband present and was educated on safe transfer tech. for bed mobility and during sit/stand     Vision       Perception     Praxis      Cognition Arousal/Alertness: Awake/alert Behavior During Therapy: WFL for tasks assessed/performed Overall Cognitive Status: Within Functional Limits for tasks assessed                                          Exercises     Shoulder Instructions       General Comments      Pertinent Vitals/ Pain       Pain Assessment: (reported no pain prior to any movement then was whimpering and grimacing throughout session) Pain Location: R hip Pain Descriptors / Indicators: Guarding;Moaning;Grimacing Pain Intervention(s): Limited activity within patient's tolerance;Monitored during session;Premedicated before session;Repositioned;Utilized relaxation techniques(cues for deep breathing)  Home Living                                          Prior Functioning/Environment  Frequency  Min 2X/week        Progress Toward Goals  OT Goals(current goals can now be found in the care plan section)  Progress towards OT goals: Progressing toward goals     Plan      Co-evaluation                 AM-PAC PT "6 Clicks" Daily Activity     Outcome Measure   Help from another person eating meals?: None Help from another person taking care of personal grooming?: A Little Help from another person toileting, which includes using toliet, bedpan, or urinal?: A Lot Help from another person bathing (including washing, rinsing, drying)?: A Lot Help from another person to put on and taking off regular upper body clothing?: A Little Help from another person to put on and taking off regular lower body clothing?: Total 6 Click Score: 15    End of  Session Equipment Utilized During Treatment: Gait belt;Rolling walker  OT Visit Diagnosis: Unsteadiness on feet (R26.81);Other abnormalities of gait and mobility (R26.89);Pain;History of falling (Z91.81) Pain - Right/Left: Right Pain - part of body: Arm;Hip;Leg   Activity Tolerance Patient tolerated treatment well   Patient Left (left standing in b.room with PT and husband to begin PT session)   Nurse Communication          Time: 3845-3646 OT Time Calculation (min): 27 min  Charges: OT General Charges $OT Visit: 1 Visit OT Treatments $Self Care/Home Management : 23-37 mins  Janice Coffin, COTA/L 12/23/2017, 1:36 PM

## 2017-12-23 NOTE — Plan of Care (Signed)

## 2017-12-23 NOTE — Progress Notes (Signed)
Orthopaedic Trauma Progress Note  S: Doing okay.  No major issues of note.  Has some throbbing in her leg.  I received her 1 unit of blood yesterday which she tolerated well.  She was up with therapy without any major complaints.  O:  Vitals:   12/22/17 1953 12/23/17 0434  BP: (!) 107/49 118/63  Pulse: 76 87  Resp:    Temp: 99.7 F (37.6 C) 98.9 F (37.2 C)  SpO2: 98% 100%    GEN: NAD, AAOx3 RLE: Incisions clean, dry and intact. Compartments soft and compressible. Neurovascularly intact  Imaging: Stable postop imaging  Labs:  Results for orders placed or performed during the hospital encounter of 12/20/17 (from the past 24 hour(s))  CBC     Status: Abnormal   Collection Time: 12/22/17  1:33 PM  Result Value Ref Range   WBC 14.4 (H) 4.0 - 10.5 K/uL   RBC 2.19 (L) 3.87 - 5.11 MIL/uL   Hemoglobin 6.8 (LL) 12.0 - 15.0 g/dL   HCT 20.8 (L) 36.0 - 46.0 %   MCV 95.0 78.0 - 100.0 fL   MCH 31.1 26.0 - 34.0 pg   MCHC 32.7 30.0 - 36.0 g/dL   RDW 13.4 11.5 - 15.5 %   Platelets 232 150 - 400 K/uL  Prepare RBC     Status: None   Collection Time: 12/22/17  3:11 PM  Result Value Ref Range   Order Confirmation      ORDER PROCESSED BY BLOOD BANK Performed at [facility], [address], [place], [place] [address]   CBC     Status: Abnormal   Collection Time: 12/23/17  4:02 AM  Result Value Ref Range   WBC 11.6 (H) 4.0 - 10.5 K/uL   RBC 2.68 (L) 3.87 - 5.11 MIL/uL   Hemoglobin 8.3 (L) 12.0 - 15.0 g/dL   HCT 25.0 (L) 36.0 - 46.0 %   MCV 93.3 78.0 - 100.0 fL   MCH 31.0 26.0 - 34.0 pg   MCHC 33.2 30.0 - 36.0 g/dL   RDW 14.5 11.5 - 15.5 %   Platelets 207 150 - 400 K/uL    Assessment: 47yo female s/p fall  Injuries: Right subtrochanteric femur fracture s/p IMN  Weightbearing: WBAT RLE  Insicional and dressing care: Dressings to remain in place until POD2  Orthopedic device(s):None needed  CV/Blood loss: Hgb 7.4-->6.8 and received on unit PRBC yesterday, Hgb 8.3 this AM,  hemodynamically stable. Will defer further blood draws unless patient symptomatic  Pain management: 1. Percocet 5/325mg  1-2 tabs q 4-6 hours 2. Morphine 2mg  q 2hr PRN IV 3. Robaxin 500 mg q 6hr PRN  VTE prophylaxis: Lovenox  ID: Ancef for 24 hours postoperatively  Foley/Lines: KVO  Medical co-morbidities: None  Impediments to Fracture Healing: None  Dispo: PT/OT, likely home with HH PT  Follow - up plan: 2 weeks   Shona Needles, MD [facility] [phone] (phone)

## 2017-12-23 NOTE — Care Management Note (Signed)
Case Management Note  Patient Details  Name: Nichole Mcmahon MRN: [id number] Date of Birth: 09-17-1970  Subjective/Objective:          Pt presents for femur fracture and wrist sprain.  Pt and husband agreeable to [facility] PT/OT and have no preference of agency.  Pt states she has crutches, walker, 3n1, wheelchair and other DME that were her mother's.  No other DME needed.          Action/Plan: Pt and husband have no preference of [place] agency and are agreeable to [facility].  Orders requested from MD.  Expected Discharge Date:                  Expected Discharge Plan:  [place]  In-House Referral:  NA  Discharge planning Services  CM Consult  Post Acute Care Choice:  Home Health Choice offered to:  Patient, Spouse  DME Arranged:  N/A DME Agency:  NA  HH Arranged:    [place] Agency:  [place]  Status of Service:  In process, will continue to follow  If discussed at Long Length of Stay Meetings, dates discussed:    Additional Comments:  Claudie Leach, RN 12/23/2017, 4:18 PM

## 2017-12-23 NOTE — Evaluation (Signed)
Physical Therapy Evaluation Patient Details Name: Nichole Mcmahon MRN: [id number] DOB: Aug 23, 1970 Today's Date: 12/23/2017   History of Present Illness  Pt is a 47 y/o female s/p IM nail R femur after sustaining a fall in her shower. Pt also with R sprained wrist. PMH including but not limited to depression and HLD.  Clinical Impression  Patient demonstrated improved ability to ambulate today. She is hesitant to weight bear but was able to ambulate from the bathroom to the bed with min a. She required cuing for base of support and heel strike. She has stairs into her house. She may benefit from stair training but if she is limited by pain her husband feels like they will be able to safely get her in with a wheelchair and help. Acute PT will continue to work with the patient.     Follow Up Recommendations      Equipment Recommendations       Recommendations for Other Services       Precautions / Restrictions Precautions Precautions: Fall Restrictions RLE Weight Bearing: Weight bearing as tolerated      Mobility  Bed Mobility Overal bed mobility: Needs Assistance Bed Mobility: Supine to Sit     Supine to sit: Mod assist;+2 for physical assistance        Transfers Overall transfer level: Needs assistance Equipment used: Rolling walker (2 wheeled) Transfers: Sit to/from [employer] Sit to Stand: Mod assist Stand pivot transfers: Mod assist       General transfer comment: increased time and effort, cueing for technique, assist to power into standing from EOB, also great difficulty noted stand/sit on bsc over commode, unable to scoot RLE forward before sitting  Ambulation/Gait Ambulation/Gait assistance: Min assist Gait Distance (Feet): 12 Feet Assistive device: Rolling walker (2 wheeled)       General Gait Details: decreased weight bearing on the right. Does not put heel down. Required mod cuing to keep stance shoulder width. Improved ability to flex  hip as she ambualted. She had difficulty weight bearing throughher right hand while ambulating.   Stairs            Wheelchair Mobility    Modified Rankin (Stroke Patients Only)       Balance Overall balance assessment: Needs assistance Sitting-balance support: Feet supported Sitting balance-Leahy Scale: Fair     Standing balance support: Bilateral upper extremity supported Standing balance-Leahy Scale: Poor Standing balance comment: mod A x2                             Pertinent Vitals/Pain Pain Assessment: (reported no pain prior to any movement then was whimpering and grimacing throughout session) Pain Location: R hip Pain Descriptors / Indicators: Guarding;Moaning;Grimacing Pain Intervention(s): Limited activity within patient's tolerance;Monitored during session;Premedicated before session;Repositioned;Utilized relaxation techniques(cues for deep breathing)    Home Living                        Prior Function                 Hand Dominance        Extremity/Trunk Assessment                Communication      Cognition Arousal/Alertness: Awake/alert Behavior During Therapy: WFL for tasks assessed/performed Overall Cognitive Status: Within Functional Limits for tasks assessed  General Comments      Exercises     Assessment/Plan    PT Assessment    PT Problem List         PT Treatment Interventions      PT Goals (Current goals can be found in the Care Plan section)  Acute Rehab PT Goals PT Goal Formulation: With patient Time For Goal Achievement: 01/05/18 Potential to Achieve Goals: Good    Frequency Min 3X/week   Barriers to discharge        Co-evaluation               AM-PAC PT "6 Clicks" Daily Activity  Outcome Measure Difficulty turning over in bed (including adjusting bedclothes, sheets and blankets)?: Unable Difficulty moving from  lying on back to sitting on the side of the bed? : Unable Difficulty sitting down on and standing up from a chair with arms (e.g., wheelchair, bedside commode, etc,.)?: Unable Help needed moving to and from a bed to chair (including a wheelchair)?: A Lot Help needed walking in hospital room?: A Lot Help needed climbing 3-5 steps with a railing? : Total 6 Click Score: 8    End of Session Equipment Utilized During Treatment: Gait belt Activity Tolerance: Patient limited by pain Patient left: in chair;with call bell/phone within reach;with chair alarm set Nurse Communication: Mobility status PT Visit Diagnosis: Other abnormalities of gait and mobility (R26.89);Pain Pain - Right/Left: Right Pain - part of body: Hip;Arm    Time: 6606-3016 PT Time Calculation (min) (ACUTE ONLY): 17 min   Charges:     PT Treatments $Gait Training: 8-22 mins         Carney Living PT DPT  12/23/2017, 2:05 PM

## 2017-12-24 NOTE — Progress Notes (Signed)
Physical Therapy Treatment Patient Details Name: Nichole Mcmahon MRN: [id number] DOB: Jul 03, 1970 Today's Date: 12/24/2017    History of Present Illness Pt is a 47 y/o female s/p IM nail R femur after sustaining a fall in her shower. Pt also with R sprained wrist. PMH including but not limited to depression and HLD.    PT Comments    Pt presents with improve tolerance for transfers and gait with right platform in place. Pt is able to perform gait x 40' with right platform with encouragement from spouse to complete task. Gait sequencing and confidence improved as distance improves. Pt requires Min A to stand from recliner and Min guard throughout majority of gait. Family has WC and RW at home and only requires a right platform to assist with gait once pt returns home until right wrist heals. Pt has spouse feel they are capable of handling all tasks once patient returns home. HEP handout given and reviewed exercises. Advised pt to try to get up 1x an hour when not asleep in order to improve mobility and reduce stiffness in right LE. Pt will benefit from continued therapy follow-up once she returns home. Pt is ready for discharge once cleared by MD.     Follow Up Recommendations  Home health PT;Supervision/Assistance - 24 hour     Equipment Recommendations  Other (comment)(right platform)    Recommendations for Other Services       Precautions / Restrictions Precautions Precautions: Fall Required Braces or Orthoses: Other Brace/Splint Other Brace/Splint: right platform Restrictions Weight Bearing Restrictions: Yes RLE Weight Bearing: Weight bearing as tolerated    Mobility  Bed Mobility               General bed mobility comments: Pt in recliner prior to PT arrival  Transfers Overall transfer level: Needs assistance Equipment used: Right platform walker Transfers: Sit to/from Stand Sit to Stand: Min assist Stand pivot transfers: Min assist       General transfer  comment: Min A for safety, cues for hand placement on chair and RW. Pt is able to stand with Min guard once clear the chair  Ambulation/Gait Ambulation/Gait assistance: Min assist;Min guard Gait Distance (Feet): 40 Feet Assistive device: Right platform walker Gait Pattern/deviations: Step-to pattern;Decreased step length - left;Decreased step length - right;Decreased stance time - right;Decreased stride length;Antalgic Gait velocity: decreased   General Gait Details: decreased weight shift to right. Pt more comfortable leading with LLE during gait. Improved sequencing as distance increases and with right platform pt assistance is bordering on min guard to supervision.    Stairs             Wheelchair Mobility    Modified Rankin (Stroke Patients Only)       Balance Overall balance assessment: Needs assistance Sitting-balance support: Feet supported Sitting balance-Leahy Scale: Good     Standing balance support: Bilateral upper extremity supported Standing balance-Leahy Scale: Poor Standing balance comment: reliant on UE support to maintain standing                            Cognition Arousal/Alertness: Awake/alert Behavior During Therapy: WFL for tasks assessed/performed Overall Cognitive Status: Within Functional Limits for tasks assessed                                 General Comments: verbal and physical demonstrations of frustration.  c/o fatigue,  pain and notable struggles with her physical limitations-provided emotional support and encouragement      Exercises General Exercises - Lower Extremity Ankle Circles/Pumps: AROM;Both;10 reps;Supine Quad Sets: AROM;Right;5 reps;Supine    General Comments        Pertinent Vitals/Pain Pain Assessment: 0-10 Pain Score: 5  Faces Pain Scale: Hurts whole lot Pain Location: R hip and R wrist Pain Descriptors / Indicators: Guarding;Moaning;Grimacing Pain Intervention(s): Monitored during  session;Premedicated before session;Repositioned    Home Living                      Prior Function            PT Goals (current goals can now be found in the care plan section) Acute Rehab PT Goals Patient Stated Goal: decrease pain Progress towards PT goals: Progressing toward goals    Frequency    Min 3X/week      PT Plan Current plan remains appropriate    Co-evaluation              AM-PAC PT "6 Clicks" Daily Activity  Outcome Measure  Difficulty turning over in bed (including adjusting bedclothes, sheets and blankets)?: Unable Difficulty moving from lying on back to sitting on the side of the bed? : Unable Difficulty sitting down on and standing up from a chair with arms (e.g., wheelchair, bedside commode, etc,.)?: Unable Help needed moving to and from a bed to chair (including a wheelchair)?: A Lot Help needed walking in hospital room?: A Lot Help needed climbing 3-5 steps with a railing? : Total 6 Click Score: 8    End of Session Equipment Utilized During Treatment: Gait belt Activity Tolerance: Patient tolerated treatment well;Patient limited by fatigue Patient left: in chair;with call bell/phone within reach;with family/visitor present Nurse Communication: Mobility status PT Visit Diagnosis: Other abnormalities of gait and mobility (R26.89);Pain Pain - Right/Left: Right Pain - part of body: Hip;Arm     Time: 3710-6269 PT Time Calculation (min) (ACUTE ONLY): 31 min  Charges:  $Gait Training: 8-22 mins $Therapeutic Activity: 8-22 mins                     Scheryl Marten PT, DPT      Jacqulyn Liner Sloan Leiter 12/24/2017, 1:04 PM

## 2017-12-24 NOTE — Progress Notes (Signed)
Occupational Therapy Treatment Patient Details Name: Nichole Mcmahon MRN: [id number] DOB: 11-16-1970 Today's Date: 12/24/2017    History of present illness Pt is a 47 y/o female s/p IM nail R femur after sustaining a fall in her shower. Pt also with R sprained wrist. PMH including but not limited to depression and HLD.   OT comments  Pt. Seen for skilled OT with spouse present.  Pt. And spouse completed bed mobility together prior to my arrival with no reported difficulty.  Transfers and mobility remain slow and limited secondary to c/o fatigue and R hip and wrist pain.  Pt. Amb. To b.room for toileiting tasks min/mod a.  Will progess ADLs as tolerated for next session along with  check to R wrist for any necessary recommendations and modifications during use.  Pt. Also inquires if splinting or brace for R wrist is needed.    Follow Up Recommendations  Home health OT    Equipment Recommendations  Tub/shower seat;Other (comment)    Recommendations for Other Services      Precautions / Restrictions Precautions Precautions: Fall Restrictions RLE Weight Bearing: Weight bearing as tolerated       Mobility Bed Mobility               General bed mobility comments: pt. and spouse report he assisted her to eob prior to my arrival. both state it went well  Transfers Overall transfer level: Needs assistance Equipment used: Rolling walker (2 wheeled) Transfers: Sit to/from [facility] Sit to Stand: Mod assist Stand pivot transfers: Min assist       General transfer comment: increased time and effort, cueing for technique, assist to power into standing from EOB, also great difficulty noted stand/sit on bsc over commode, unable to scoot RLE forward before sitting    Balance                                           ADL either performed or assessed with clinical judgement   ADL Overall ADL's : Needs assistance/impaired                          Toilet Transfer: Cueing for sequencing;Cueing for safety;RW;BSC;Regular Toilet;Grab bars;Ambulation;Minimal assistance Toilet Transfer Details (indicate cue type and reason): increased time for ambulation, struggling with gait sequence and weight bearing (discussed with PTA on sun. before their session with her) Toileting- Clothing Manipulation and Hygiene: Set up;Sitting/lateral lean       Functional mobility during ADLs: Cueing for sequencing;Cueing for safety;Rolling walker;Minimal assistance General ADL Comments: fearful to initiate steps with rw. husband present and remains very active and supportive during tx sessions.  both report he had assisted with bed mobility prior to my arrival and felt comfortable with out supine to sit had gone.  pt. SLOW to amb. c/o constant R hip and wrist pain.  OTR/L to determine if brace/splint needed for R wrist or any other indications or recommendations needed for pain management and ROM.       Vision       Perception     Praxis      Cognition Arousal/Alertness: Awake/alert Behavior During Therapy: Agitated Overall Cognitive Status: Within Functional Limits for tasks assessed  General Comments: verbal and physical demonstrations of frustration.  c/o fatigue, pain and notable struggles with her physical limitations-provided emotional support and encouragement        Exercises     Shoulder Instructions       General Comments      Pertinent Vitals/ Pain       Faces Pain Scale: Hurts whole lot Pain Location: R hip and R wrist Pain Descriptors / Indicators: Guarding;Moaning;Grimacing Pain Intervention(s): Limited activity within patient's tolerance;Monitored during session;Premedicated before session;Repositioned;Relaxation;Ice applied  Home Living                                          Prior Functioning/Environment              Frequency  Min 2X/week         Progress Toward Goals  OT Goals(current goals can now be found in the care plan section)  Progress towards OT goals: Progressing toward goals     Plan      Co-evaluation                 AM-PAC PT "6 Clicks" Daily Activity     Outcome Measure   Help from another person eating meals?: None Help from another person taking care of personal grooming?: A Little Help from another person toileting, which includes using toliet, bedpan, or urinal?: A Lot Help from another person bathing (including washing, rinsing, drying)?: A Lot Help from another person to put on and taking off regular upper body clothing?: A Little Help from another person to put on and taking off regular lower body clothing?: Total 6 Click Score: 15    End of Session Equipment Utilized During Treatment: Gait belt;Rolling walker  OT Visit Diagnosis: Unsteadiness on feet (R26.81);Other abnormalities of gait and mobility (R26.89);Pain;History of falling (Z91.81) Pain - Right/Left: Right Pain - part of body: Arm;Hip;Leg   Activity Tolerance Patient limited by fatigue;Patient limited by pain   Patient Left in chair;with call bell/phone within reach;with family/visitor present   Nurse Communication          Time: 7680-8811 OT Time Calculation (min): 22 min  Charges: OT General Charges $OT Visit: 1 Visit OT Treatments $Self Care/Home Management : 8-22 mins  Janice Coffin, COTA/L 12/24/2017, 10:18 AM

## 2017-12-24 NOTE — Progress Notes (Signed)
Discharge instructions completed with pt.  Pt verbalized understanding of the information.  Pt denies chest pain, shortness of breath, dizziness, lightheadedness, and n/v.  Pt's IV d/c'ed. Pt discharged home.  

## 2017-12-24 NOTE — Discharge Summary (Signed)
Physician Discharge Summary  Patient ID: Nichole Mcmahon MRN: [id number] DOB/AGE: 11/30/1970 47 y.o.  Admit date: 12/20/2017 Discharge date: 12/24/2017  Admission Diagnoses:  Right subtrochanteric femur fracture  Discharge Diagnoses:  Active Problems:   Closed displaced subtrochanteric fracture of right femur (HCC)   Closed right hip fracture, initial encounter ([place])   Displaced subtrochanteric fracture of right femur, initial encounter for closed fracture ([place]) ABLA, transfused  Past Medical History:  Diagnosis Date  . Depression   . History of eye infection   . Hx gestational diabetes   . Hyperlipidemia     Surgeries: Procedure(s): INTRAMEDULLARY (IM) NAIL FEMORAL on 12/21/2017   Consultants (if any): Treatment Team:  Haddix, Thomasene Lot, MD  Discharged Condition: Improved  Hospital Course: Nichole Mcmahon is an 47 y.o. female who was admitted 12/20/2017 with a diagnosis of Right subtrochanteric femur fracture  and went to the operating room on 12/21/2017 and underwent the above named procedures.    She was given perioperative antibiotics:  Anti-infectives (From admission, onward)   Start     Dose/Rate Route Frequency Ordered Stop   12/21/17 1530  ceFAZolin (ANCEF) IVPB 2g/100 mL premix     2 g 200 mL/hr over 30 Minutes Intravenous Every 8 hours 12/21/17 1246 12/22/17 1559   12/21/17 0857  vancomycin (VANCOCIN) powder  Status:  Discontinued       As needed 12/21/17 0857 12/21/17 0944   12/21/17 0715  ceFAZolin (ANCEF) IVPB 2g/100 mL premix     2 g 200 mL/hr over 30 Minutes Intravenous On call to O.R. 12/21/17 0706 12/21/17 0756   12/21/17 0703  ceFAZolin (ANCEF) 2-4 GM/100ML-% IVPB    Note to Pharmacy:  Trixie Deis   : cabinet override      12/21/17 0703 12/21/17 0726    .  She was given sequential compression devices, early ambulation, and aspirin for DVT prophylaxis.  She benefited maximally from the hospital stay and there were no complications.  She also complained of  wrist pain, although x-rays were negative for fracture, she was given a platform walker to assist with ambulation.  Recent vital signs:  Vitals:   12/24/17 0433 12/24/17 1331  BP: 100/60 106/63  Pulse: 81 96  Resp:  16  Temp: 98.3 F (36.8 C) 98.7 F (37.1 C)  SpO2: 100% 100%    Recent laboratory studies:  Lab Results  Component Value Date   HGB 8.3 (L) 12/23/2017   HGB 6.8 (LL) 12/22/2017   HGB 7.4 (L) 12/22/2017   Lab Results  Component Value Date   WBC 11.6 (H) 12/23/2017   PLT 207 12/23/2017   No results found for: INR Lab Results  Component Value Date   NA 137 12/20/2017   K 3.2 (L) 12/20/2017   CL 105 12/20/2017   CO2 23 12/20/2017   BUN 14 12/20/2017   CREATININE 0.67 12/20/2017   GLUCOSE 141 (H) 12/20/2017    Discharge Medications:   Allergies as of 12/24/2017   No Known Allergies     Medication List    TAKE these medications   aspirin EC 325 MG tablet Take 1 tablet (325 mg total) by mouth 2 (two) times daily.   b complex vitamins capsule Take 1 capsule by mouth daily.   CALCIUM + D PO Take 1 tablet by mouth daily.   esomeprazole 40 MG capsule Commonly known as:  NEXIUM Take 40 mg by mouth daily before breakfast.   etonogestrel-ethinyl estradiol 0.12-0.015 MG/24HR vaginal ring Commonly known  as:  NUVARING Insert vaginally and leave in place for 3 consecutive weeks, then remove for 1 week. What changed:    how much to take  how to take this  when to take this   FISH OIL OMEGA-3 PO Take 1 capsule by mouth daily.   methocarbamol 750 MG tablet Commonly known as:  ROBAXIN Take 1 tablet (750 mg total) by mouth every 6 (six) hours as needed for muscle spasms.   multivitamin per tablet Take 1 tablet by mouth daily.   oxyCODONE-acetaminophen 5-325 MG tablet Commonly known as:  PERCOCET/ROXICET Take 1-2 tablets by mouth every 4 (four) hours as needed for moderate pain.            Durable Medical Equipment  (From admission, onward)          Start     Ordered   12/24/17 1127  For home use only DME Walker platform  Once    Question:  Patient needs a walker to treat with the following condition  Answer:  Other fracture of right femur, initial encounter for closed fracture [facility])   12/24/17 1129          Diagnostic Studies: Dg Chest 1 View  Result Date: 12/20/2017 CLINICAL DATA:  Status post fall, with concern for chest injury. Initial encounter. EXAM: CHEST  1 VIEW COMPARISON:  None. FINDINGS: The lungs are well-aerated and clear. There is no evidence of focal opacification, pleural effusion or pneumothorax. The cardiomediastinal silhouette is within normal limits. No acute osseous abnormalities are seen. IMPRESSION: No acute cardiopulmonary process seen. No displaced rib fractures identified. Electronically Signed   By: Garald Balding M.D.   On: 12/20/2017 21:15   Dg Wrist Complete Right  Result Date: 12/20/2017 CLINICAL DATA:  Status post fall, with acute onset of right wrist pain. Initial encounter. EXAM: RIGHT WRIST - COMPLETE 3+ VIEW COMPARISON:  None. FINDINGS: There is no evidence of fracture or dislocation. The carpal rows are intact, and demonstrate normal alignment. The joint spaces are preserved. Mild negative ulnar variance is noted. No significant soft tissue abnormalities are seen. IMPRESSION: No evidence of fracture or dislocation. Electronically Signed   By: Garald Balding M.D.   On: 12/20/2017 21:16   Dg Knee Right Port  Result Date: 12/20/2017 CLINICAL DATA:  Status post fall, with known right femur fracture. Assess for fracture at the knee. Initial encounter. EXAM: PORTABLE RIGHT KNEE - 1-2 VIEW COMPARISON:  None. FINDINGS: There is no evidence of fracture or dislocation. The joint spaces are preserved. No significant degenerative change is seen; the patellofemoral joint is grossly unremarkable in appearance. No significant joint effusion is seen. The visualized soft tissues are normal in appearance.  IMPRESSION: No evidence of fracture or dislocation. Electronically Signed   By: Garald Balding M.D.   On: 12/20/2017 22:31   Dg C-arm 1-60 Min  Result Date: 12/21/2017 CLINICAL DATA:  47 year old female with right femur fracture. Subsequent encounter. EXAM: DG C-ARM 61-120 MIN Fluoroscopic time: 4 minutes and 14 seconds. COMPARISON:  12/20/2017 FINDINGS: Nine intraoperative C-arm views submitted for review after surgery. Right sub/intertrochanteric fracture reduced with intramedullary rod, proximal femoral neck/head screws and distal fixation screws. No complication noted. IMPRESSION: Open reduction and internal fixation right sub/intertrochanteric fracture. Electronically Signed   By: Genia Del M.D.   On: 12/21/2017 11:18   Dg C-arm 1-60 Min  Result Date: 12/21/2017 CLINICAL DATA:  47 year old female with right femur fracture. Subsequent encounter. EXAM: DG C-ARM 61-120 MIN Fluoroscopic time: 4  minutes and 14 seconds. COMPARISON:  12/20/2017 FINDINGS: Nine intraoperative C-arm views submitted for review after surgery. Right sub/intertrochanteric fracture reduced with intramedullary rod, proximal femoral neck/head screws and distal fixation screws. No complication noted. IMPRESSION: Open reduction and internal fixation right sub/intertrochanteric fracture. Electronically Signed   By: Genia Del M.D.   On: 12/21/2017 11:18   Dg Hip Unilat  With Pelvis 2-3 Views Right  Result Date: 12/20/2017 CLINICAL DATA:  Status post fall in shower, with right hip rotation and pain. Initial encounter. EXAM: DG HIP (WITH OR WITHOUT PELVIS) 2-3V RIGHT COMPARISON:  None. FINDINGS: There is a prominent displaced oblique fracture through the proximal right femur, intertrochanteric in nature, though involving the proximal femoral diaphysis. The right femoral head remains seated at the acetabulum. No significant joint space narrowing is seen. The left hip joint is grossly unremarkable. The sacroiliac joints are  unremarkable in appearance. The visualized bowel gas pattern is within normal limits. Soft tissue swelling is noted about the proximal right thigh. IMPRESSION: Prominent displaced oblique fracture through the proximal right femur, intertrochanteric in nature, though involving the proximal femoral diaphysis. Electronically Signed   By: Garald Balding M.D.   On: 12/20/2017 21:15   Dg Femur, Min 2 Views Right  Result Date: 12/21/2017 CLINICAL DATA:  47 year old female with right femur fracture. Subsequent encounter. EXAM: RIGHT FEMUR 2 VIEWS Fluoroscopic time: 4 minutes and 14 seconds. COMPARISON:  12/20/2017. FINDINGS: Nine intraoperative C-arm views submitted for review after surgery. Reduction of right sub/intertrochanteric fracture utilizing intramedullary rod, proximal femoral head and neck screws and distal fixation screws. Better alignment of fracture fragments. No complication noted. IMPRESSION: Open reduction and internal fixation right sub/intertrochanteric fracture. Electronically Signed   By: Genia Del M.D.   On: 12/21/2017 11:24   Dg Femur Port, Min 2 Views Right  Result Date: 12/21/2017 CLINICAL DATA:  47 year old female with right femur fracture. Subsequent encounter. EXAM: RIGHT FEMUR PORTABLE 2 VIEW COMPARISON:  Intraoperative exam 12/21/2017 and preoperative exam 12/20/2017. FINDINGS: Right sub/intertrochanteric femur fracture reduced with right femoral intramedullary rod with proximal femoral head/neck screws and distal fixation screws. Fracture fragments in better alignment although still slightly separated and overlapping. IMPRESSION: Open reduction and internal fixation right sub/intertrochanteric fracture. Electronically Signed   By: Genia Del M.D.   On: 12/21/2017 11:22    Disposition: Discharge disposition: 01-Home or Self Care         Follow-up Information    Haddix, Thomasene Lot, MD. Schedule an appointment as soon as possible for a visit in 2 week(s).   Specialty:   Orthopedic Surgery Contact information: [address] [place] [address] [phone]        [facility] Follow up.   Specialty:  Home Health Services Why:  Therapists will call you to set up appointments. Contact information: [address] [place] [address] [phone]            Signed: Johnny Bridge 12/24/2017, 4:33 PM

## 2017-12-24 NOTE — Care Management Note (Signed)
Case Management Note  Patient Details  Name: Nichole Mcmahon MRN: [id number] Date of Birth: 1971-02-26  Subjective/Objective:         Pt presents for femur fracture and wrist sprain.  Pt from home with husband.  They choose [facility] for [facility] needs.           Action/Plan: Order placed and Jermaine with [facility] given referral.  They already have walker at home.  [facility] will provide a platform to attach to the walker they have.    Expected Discharge Date:  12/24/17               Expected Discharge Plan:  [place]  In-House Referral:  NA  Discharge planning Services  CM Consult  Post Acute Care Choice:  Home Health Choice offered to:  Patient, Spouse  DME Arranged:  N/A DME Agency:  NA  HH Arranged:  PT, OT HH Agency:  [facility]  Status of Service:  In process, will continue to follow  If discussed at Long Length of Stay Meetings, dates discussed:    Additional Comments:  Claudie Leach, RN 12/24/2017, 2:46 PM

## 2017-12-26 ENCOUNTER — Encounter (HOSPITAL_COMMUNITY): Payer: Self-pay | Admitting: Student

## 2018-01-01 ENCOUNTER — Other Ambulatory Visit: Payer: Self-pay

## 2018-01-01 DIAGNOSIS — Z3044 Encounter for surveillance of vaginal ring hormonal contraceptive device: Secondary | ICD-10-CM

## 2018-01-01 MED ORDER — ETONOGESTREL-ETHINYL ESTRADIOL 0.12-0.015 MG/24HR VA RING
VAGINAL_RING | VAGINAL | 0 refills | Status: DC
Start: 2018-01-01 — End: 2018-02-27

## 2018-01-04 DIAGNOSIS — S7221XD Displaced subtrochanteric fracture of right femur, subsequent encounter for closed fracture with routine healing: Secondary | ICD-10-CM | POA: Diagnosis not present

## 2018-01-04 DIAGNOSIS — S63501D Unspecified sprain of right wrist, subsequent encounter: Secondary | ICD-10-CM | POA: Diagnosis not present

## 2018-01-04 DIAGNOSIS — E785 Hyperlipidemia, unspecified: Secondary | ICD-10-CM | POA: Diagnosis not present

## 2018-01-09 ENCOUNTER — Encounter: Payer: BLUE CROSS/BLUE SHIELD | Admitting: Women's Health

## 2018-01-09 DIAGNOSIS — S7221XD Displaced subtrochanteric fracture of right femur, subsequent encounter for closed fracture with routine healing: Secondary | ICD-10-CM | POA: Diagnosis not present

## 2018-01-10 DIAGNOSIS — S63501D Unspecified sprain of right wrist, subsequent encounter: Secondary | ICD-10-CM | POA: Diagnosis not present

## 2018-01-10 DIAGNOSIS — S7221XD Displaced subtrochanteric fracture of right femur, subsequent encounter for closed fracture with routine healing: Secondary | ICD-10-CM | POA: Diagnosis not present

## 2018-01-10 DIAGNOSIS — E785 Hyperlipidemia, unspecified: Secondary | ICD-10-CM | POA: Diagnosis not present

## 2018-01-17 DIAGNOSIS — M25551 Pain in right hip: Secondary | ICD-10-CM | POA: Diagnosis not present

## 2018-01-17 DIAGNOSIS — S7291XD Unspecified fracture of right femur, subsequent encounter for closed fracture with routine healing: Secondary | ICD-10-CM | POA: Diagnosis not present

## 2018-01-23 DIAGNOSIS — S7291XD Unspecified fracture of right femur, subsequent encounter for closed fracture with routine healing: Secondary | ICD-10-CM | POA: Diagnosis not present

## 2018-01-23 DIAGNOSIS — M25551 Pain in right hip: Secondary | ICD-10-CM | POA: Diagnosis not present

## 2018-01-25 DIAGNOSIS — M25551 Pain in right hip: Secondary | ICD-10-CM | POA: Diagnosis not present

## 2018-01-25 DIAGNOSIS — S7291XD Unspecified fracture of right femur, subsequent encounter for closed fracture with routine healing: Secondary | ICD-10-CM | POA: Diagnosis not present

## 2018-01-30 DIAGNOSIS — M25551 Pain in right hip: Secondary | ICD-10-CM | POA: Diagnosis not present

## 2018-01-30 DIAGNOSIS — S7291XD Unspecified fracture of right femur, subsequent encounter for closed fracture with routine healing: Secondary | ICD-10-CM | POA: Diagnosis not present

## 2018-02-01 DIAGNOSIS — S7291XD Unspecified fracture of right femur, subsequent encounter for closed fracture with routine healing: Secondary | ICD-10-CM | POA: Diagnosis not present

## 2018-02-01 DIAGNOSIS — M25551 Pain in right hip: Secondary | ICD-10-CM | POA: Diagnosis not present

## 2018-02-06 DIAGNOSIS — S7221XD Displaced subtrochanteric fracture of right femur, subsequent encounter for closed fracture with routine healing: Secondary | ICD-10-CM | POA: Diagnosis not present

## 2018-02-27 ENCOUNTER — Ambulatory Visit (INDEPENDENT_AMBULATORY_CARE_PROVIDER_SITE_OTHER): Payer: Commercial Managed Care - PPO | Admitting: Women's Health

## 2018-02-27 ENCOUNTER — Encounter: Payer: Self-pay | Admitting: Women's Health

## 2018-02-27 VITALS — BP 102/82 | Ht 63.0 in | Wt 111.0 lb

## 2018-02-27 DIAGNOSIS — Z01419 Encounter for gynecological examination (general) (routine) without abnormal findings: Secondary | ICD-10-CM | POA: Diagnosis not present

## 2018-02-27 DIAGNOSIS — Z3044 Encounter for surveillance of vaginal ring hormonal contraceptive device: Secondary | ICD-10-CM

## 2018-02-27 MED ORDER — ETONOGESTREL-ETHINYL ESTRADIOL 0.12-0.015 MG/24HR VA RING: VAGINAL_RING | VAGINAL | 4 refills | Status: DC

## 2018-02-27 NOTE — Patient Instructions (Signed)

## 2018-02-27 NOTE — Progress Notes (Signed)
Nichole Mcmahon [birth year] [id number]    History:    Presents for annual exam.  Amenorrheic on NuvaRing continuously without complaint.  Normal Pap and mammogram history.  12/2017 right hip fracture from fall in the shower while shaving legs/no vertigo, is back to work half days.  Had a blood transfusion after surgery.  Reports orthopedist ruled out osteoporosis.  History of GDM.    Past medical history, past surgical history, family history and social history were all reviewed and documented in the EPIC chart.  Works for a Pharmacist, community.  Mother, diabetes, hypertension, history of a kidney transplant from hypertension.  Father deceased.  Son 23 doing well.  ROS:  A ROS was performed and pertinent positives and negatives are included.  Exam:  Vitals:   02/27/18 0859  BP: 102/82  Weight: 111 lb (50.3 kg)  Height: 5\' 3"  (1.6 m)   Body mass index is 19.66 kg/m.   General appearance:  Normal Thyroid:  Symmetrical, normal in size, without palpable masses or nodularity. Respiratory  Auscultation:  Clear without wheezing or rhonchi Cardiovascular  Auscultation:  Regular rate, without rubs, murmurs or gallops  Edema/varicosities:  Not grossly evident Abdominal  Soft,nontender, without masses, guarding or rebound.  Liver/spleen:  No organomegaly noted  Hernia:  None appreciated  Skin  Inspection:  Grossly normal   Breasts: Examined lying and sitting.     Right: Without masses, retractions, discharge or axillary adenopathy.     Left: Without masses, retractions, discharge or axillary adenopathy. Gentitourinary   Inguinal/mons:  Normal without inguinal adenopathy  External genitalia:  Normal  BUS/Urethra/Skene's glands:  Normal  Vagina:  Normal  Cervix:  Normal  Uterus:   normal in size, shape and contour.  Midline and mobile  Adnexa/parametria:     Rt: Without masses or tenderness.   Lt: Without masses or tenderness.  Anus and perineum: Normal  Digital rectal exam: Normal sphincter  tone without palpated masses or tenderness  Assessment/Plan:  47 y.o. MWF G1, P1 for annual exam with no complaints.  Amenorrheic on NuvaRing continuously 12/2017 right hip fracture continues with PT History of GDM  Plan: SBE's, continue annual screening mammogram due next month.  Continue with physical therapy and increase exercise gradually, fall prevention, home safety reviewed.  Vitamin D 1000 daily encouraged.  NuvaRing prescription, proper use, slight risk for blood clots and strokes reviewed.  CBC, CMP, Pap with HR HPV typing, new screening guidelines reviewed.   [name] [facility], 9:41 AM 02/27/2018

## 2018-02-27 NOTE — Addendum Note (Signed)
Addended by: Lorine Bears on: 02/27/2018 10:04 AM   Modules accepted: Orders

## 2018-02-28 LAB — URINALYSIS, COMPLETE W/RFL CULTURE
BILIRUBIN URINE: NEGATIVE
Bacteria, UA: NONE SEEN /HPF
GLUCOSE, UA: NEGATIVE
Hgb urine dipstick: NEGATIVE
Hyaline Cast: NONE SEEN /LPF
KETONES UR: NEGATIVE
LEUKOCYTE ESTERASE: NEGATIVE
NITRITES URINE, INITIAL: NEGATIVE
Protein, ur: NEGATIVE
RBC / HPF: NONE SEEN /HPF (ref 0–2)
SPECIFIC GRAVITY, URINE: 1.011 (ref 1.001–1.03)
SQUAMOUS EPITHELIAL / LPF: NONE SEEN /HPF (ref <=5)
WBC, UA: NONE SEEN /HPF (ref 0–5)
pH: 5.5 (ref 5.0–8.0)

## 2018-02-28 LAB — CBC WITH DIFFERENTIAL/PLATELET
Basophils Absolute: 51 cells/uL (ref 0–200)
Basophils Relative: 0.5 %
Eosinophils Absolute: 61 cells/uL (ref 15–500)
Eosinophils Relative: 0.6 %
HCT: 39 % (ref 35.0–45.0)
Hemoglobin: 13.2 g/dL (ref 11.7–15.5)
Lymphs Abs: 2444 cells/uL (ref 850–3900)
MCH: 30.7 pg (ref 27.0–33.0)
MCHC: 33.8 g/dL (ref 32.0–36.0)
MCV: 90.7 fL (ref 80.0–100.0)
MONOS PCT: 6.5 %
MPV: 10.5 fL (ref 7.5–12.5)
NEUTROS PCT: 68.2 %
Neutro Abs: 6888 cells/uL (ref 1500–7800)
PLATELETS: 369 10*3/uL (ref 140–400)
RBC: 4.3 10*6/uL (ref 3.80–5.10)
RDW: 12 % (ref 11.0–15.0)
TOTAL LYMPHOCYTE: 24.2 %
WBC: 10.1 10*3/uL (ref 3.8–10.8)
WBCMIX: 657 {cells}/uL (ref 200–950)

## 2018-02-28 LAB — COMPREHENSIVE METABOLIC PANEL
AG RATIO: 1.4 (calc) (ref 1.0–2.5)
ALBUMIN MSPROF: 4 g/dL (ref 3.6–5.1)
ALT: 8 U/L (ref 6–29)
AST: 11 U/L (ref 10–35)
Alkaline phosphatase (APISO): 69 U/L (ref 33–115)
BILIRUBIN TOTAL: 0.3 mg/dL (ref 0.2–1.2)
BUN: 14 mg/dL (ref 7–25)
CALCIUM: 9.1 mg/dL (ref 8.6–10.2)
CO2: 26 mmol/L (ref 20–32)
Chloride: 101 mmol/L (ref 98–110)
Creat: 0.66 mg/dL (ref 0.50–1.10)
GLUCOSE: 84 mg/dL (ref 65–99)
Globulin: 2.9 g/dL (calc) (ref 1.9–3.7)
POTASSIUM: 3.8 mmol/L (ref 3.5–5.3)
SODIUM: 136 mmol/L (ref 135–146)
TOTAL PROTEIN: 6.9 g/dL (ref 6.1–8.1)

## 2018-02-28 LAB — NO CULTURE INDICATED

## 2018-03-01 LAB — PAP, TP IMAGING W/ HPV RNA, RFLX HPV TYPE 16,18/45: HPV DNA High Risk: NOT DETECTED

## 2018-03-12 ENCOUNTER — Other Ambulatory Visit: Payer: Self-pay | Admitting: Gynecology

## 2018-03-12 DIAGNOSIS — Z1231 Encounter for screening mammogram for malignant neoplasm of breast: Secondary | ICD-10-CM

## 2018-03-27 DIAGNOSIS — S7221XD Displaced subtrochanteric fracture of right femur, subsequent encounter for closed fracture with routine healing: Secondary | ICD-10-CM | POA: Diagnosis not present

## 2018-04-02 ENCOUNTER — Other Ambulatory Visit: Payer: Self-pay | Admitting: Student

## 2018-04-02 DIAGNOSIS — M8580 Other specified disorders of bone density and structure, unspecified site: Secondary | ICD-10-CM

## 2018-04-11 ENCOUNTER — Ambulatory Visit
Admission: RE | Admit: 2018-04-11 | Discharge: 2018-04-11 | Disposition: A | Discharge status: Home / Self Care | Payer: Commercial Managed Care - PPO | Source: Ambulatory Visit | Attending: Student | Admitting: Student

## 2018-04-11 DIAGNOSIS — M8580 Other specified disorders of bone density and structure, unspecified site: Secondary | ICD-10-CM

## 2018-04-27 ENCOUNTER — Ambulatory Visit
Admission: RE | Admit: 2018-04-27 | Discharge: 2018-04-27 | Disposition: A | Discharge status: Home / Self Care | Payer: Commercial Managed Care - PPO | Source: Ambulatory Visit | Attending: Gynecology | Admitting: Gynecology

## 2018-04-27 DIAGNOSIS — Z1231 Encounter for screening mammogram for malignant neoplasm of breast: Secondary | ICD-10-CM

## 2018-08-27 ENCOUNTER — Telehealth: Payer: Self-pay | Admitting: Primary Care

## 2018-08-27 NOTE — Telephone Encounter (Signed)
Please see 08/24/18 pt messaging note; does pt need to schedule virtual visit?

## 2018-08-27 NOTE — Telephone Encounter (Addendum)
Provider didn't respond as this was sent to the main message pool after hours on Friday afternoon. I connected with patient today, offered a virtual visit for which she declined as she states that she's feeling better. See My Chart messages

## 2018-08-27 NOTE — Telephone Encounter (Signed)
Nichole Mcmahon, virtual visit please. Thanks!

## 2018-08-27 NOTE — Telephone Encounter (Signed)
[facility] Patient Name: TYMEKA PRIVETTE Gender: Female DOB: [birth year] Age: 48 Y 59 M 21 D Return Phone Number: [phone] (Primary) Address: City/State/Zip: [place] [address] Client [facility] - Client Client Site [place] - Night Physician Alma Friendly - NP Contact Type Call Who Is Calling Patient / Member / Family / Caregiver Call Type Triage / Clinical Relationship To Patient Self Return Phone Number [phone] (Primary) Chief Complaint Eye Pain Reason for Call Symptomatic / Request for [place] states she sent the MD an email this afternoon and MD did not respond. Pt has a huge bump near eyelash and the pain is radiating through her eye and making head hurt. What would the MD recommend. No fever. Translation No Nurse Assessment Nurse: Carlis Abbott, RN, Estill Bamberg Date/Time (Eastern Time): 08/24/2018 7:07:00 PM Confirm and document reason for call. If symptomatic, describe symptoms. ---Caller states that she has a bump in her left nostril. The bump is yellow/green. This started 3 days ago. Denies fever, or other sx. Has the patient had close contact with a person known or suspected to have the novel coronavirus illness OR traveled / lives in area with major community spread (including international travel) in the last 14 days from the onset of symptoms? * If Asymptomatic, screen for exposure and travel within the last 14 days. ---No Does the patient have any new or worsening symptoms? ---Yes Will a triage be completed? ---Yes Related visit to physician within the last 2 weeks? ---No Does the PT have any chronic conditions? (i.e. diabetes, asthma, this includes High risk factors for pregnancy, etc.) ---No Is the patient pregnant or possibly pregnant? (Ask all females between the ages of  49-55) ---No Is this a behavioral health or substance abuse call? ---No Guidelines Guideline Title Affirmed Question Affirmed Notes Nurse Date/Time (Eastern Time) Impetigo (Infected Sore) Sores and crusts are around openings to nose, or anywhere else on face Carlis Abbott, RN, Estill Bamberg 08/24/2018 7:09:19 PM Disp. Time Eilene Ghazi Time) Disposition Final User PLEASE NOTE: All timestamps contained within this report are represented as [place] Standard Time. CONFIDENTIALTY NOTICE: This fax transmission is intended only for the addressee. It contains information that is legally privileged, confidential or otherwise protected from use or disclosure. If you are not the intended recipient, you are strictly prohibited from reviewing, disclosing, copying using or disseminating any of this information or taking any action in reliance on or regarding this information. If you have received this fax in error, please notify us immediately by telephone so that we can arrange for its return to [place]. Phone: [phone], Toll-Free: [phone], Fax: [phone] Page: 2 of 2 Call Id: [id number] 08/24/2018 7:12:23 PM See PCP within 24 Hours Yes Carlis Abbott, RN, Shelly Coss Disagree/Comply Comply Caller Understands Yes PreDisposition [place] Advice Given Per Guideline SEE PCP WITHIN 24 HOURS: * IF OFFICE WILL BE OPEN: You need to be seen within the next 24 hours. Call your doctor (or NP/PA) when the office opens and make an appointment. * IF OFFICE WILL BE CLOSED AND NO PCP (PRIMARY CARE PROVIDER) SECOND-LEVEL TRIAGE: You need to be seen within the next 24 hours. A clinic or an urgent care center is often a good source of care if your doctor's office is closed or you can't get an appointment. CLEANING: * Wash the area 2-3 times daily with antibacterial soap and warm water. * Gently remove  any scab. The bacteria live underneath the scab. You may need to soak the scab off by placing a warm wet washcloth (or gauze) on the  sore for 10 minutes. ANTIBIOTIC OINTMENT: * Apply an antibiotic ointment 3 times per day. AVOID PICKING: Avoid scratching and picking. This can spread a skin infection. CONTAGIOUSNESS: * Impetigo is contagious by skin to skin contact. * Wash your hands frequently and avoid touching the sore. CALL BACK IF: * Fever occurs * You become worse. CARE ADVICE given per Impetigo (Adult) guideline. Referrals [facility]

## 2018-08-27 NOTE — Telephone Encounter (Signed)
Called to schedule pt for a virtual office visit per [place]. Lvm asking patient to call office.

## 2018-10-26 ENCOUNTER — Encounter: Payer: Commercial Managed Care - PPO | Admitting: Primary Care

## 2018-10-26 ENCOUNTER — Ambulatory Visit (INDEPENDENT_AMBULATORY_CARE_PROVIDER_SITE_OTHER): Payer: Commercial Managed Care - PPO | Admitting: Primary Care

## 2018-10-26 ENCOUNTER — Encounter: Payer: Self-pay | Admitting: Primary Care

## 2018-10-26 ENCOUNTER — Other Ambulatory Visit: Payer: Self-pay

## 2018-10-26 VITALS — BP 110/64 | HR 72 | Temp 97.9°F | Ht 63.0 in | Wt 109.5 lb

## 2018-10-26 DIAGNOSIS — K219 Gastro-esophageal reflux disease without esophagitis: Secondary | ICD-10-CM | POA: Diagnosis not present

## 2018-10-26 DIAGNOSIS — Z0000 Encounter for general adult medical examination without abnormal findings: Secondary | ICD-10-CM

## 2018-10-26 LAB — LIPID PANEL
Cholesterol: 168 mg/dL (ref 0–200)
HDL: 46.2 mg/dL (ref >39.00)
LDL Cholesterol: 99 mg/dL (ref 0–99)
NonHDL: 122.02
Total CHOL/HDL Ratio: 4
Triglycerides: 115 mg/dL (ref 0.0–149.0)
VLDL: 23 mg/dL (ref 0.0–40.0)

## 2018-10-26 LAB — COMPREHENSIVE METABOLIC PANEL
ALT: 8 U/L (ref 0–35)
AST: 11 U/L (ref 0–37)
Albumin: 3.9 g/dL (ref 3.5–5.2)
Alkaline Phosphatase: 34 U/L — ABNORMAL LOW (ref 39–117)
BUN: 11 mg/dL (ref 6–23)
CO2: 26 mEq/L (ref 19–32)
Calcium: 9.2 mg/dL (ref 8.4–10.5)
Chloride: 105 mEq/L (ref 96–112)
Creatinine, Ser: 0.78 mg/dL (ref 0.40–1.20)
GFR: 78.59 mL/min (ref >60.00)
Glucose, Bld: 83 mg/dL (ref 70–99)
Potassium: 4 mEq/L (ref 3.5–5.1)
Sodium: 139 mEq/L (ref 135–145)
Total Bilirubin: 0.4 mg/dL (ref 0.2–1.2)
Total Protein: 6.6 g/dL (ref 6.0–8.3)

## 2018-10-26 LAB — VITAMIN D 25 HYDROXY (VIT D DEFICIENCY, FRACTURES): VITD: 74.12 ng/mL (ref 30.00–100.00)

## 2018-10-26 NOTE — Assessment & Plan Note (Addendum)
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 Compliant to calcium and vitamin D. Continue same.

## 2018-10-26 NOTE — Assessment & Plan Note (Signed)
Doing well on omeprazole 40 mg, continue same.

## 2018-10-26 NOTE — Patient Instructions (Signed)
Stop by the lab prior to leaving today. I will notify you of your results once received.   Continue exercising. You should be getting 150 minutes of exercise weekly.  Continue to eat a healthy diet with plenty of vegetables, fruit, whole grains, lean protein.  Ensure you are consuming 64 ounces of water daily.  It was a pleasure to see you today!   Preventive Care 40-64 Years, Female Preventive care refers to lifestyle choices and visits with your health care provider that can promote health and wellness. What does preventive care include?   A yearly physical exam. This is also called an annual well check.  Dental exams once or twice a year.  Routine eye exams. Ask your health care provider how often you should have your eyes checked.  Personal lifestyle choices, including: ? Daily care of your teeth and gums. ? Regular physical activity. ? Eating a healthy diet. ? Avoiding tobacco and drug use. ? Limiting alcohol use. ? Practicing safe sex. ? Taking low-dose aspirin daily starting at age 58. ? Taking vitamin and mineral supplements as recommended by your health care provider. What happens during an annual well check? The services and screenings done by your health care provider during your annual well check will depend on your age, overall health, lifestyle risk factors, and family history of disease. Counseling Your health care provider may ask you questions about your:  Alcohol use.  Tobacco use.  Drug use.  Emotional well-being.  Home and relationship well-being.  Sexual activity.  Eating habits.  Work and work Statistician.  Method of birth control.  Menstrual cycle.  Pregnancy history. Screening You may have the following tests or measurements:  Height, weight, and BMI.  Blood pressure.  Lipid and cholesterol levels. These may be checked every 5 years, or more frequently if you are over 28 years old.  Skin check.  Lung cancer screening. You may  have this screening every year starting at age 5 if you have a 30-pack-year history of smoking and currently smoke or have quit within the past 15 years.  Colorectal cancer screening. All adults should have this screening starting at age 18 and continuing until age 70. Your health care provider may recommend screening at age 77. You will have tests every 1-10 years, depending on your results and the type of screening test. People at increased risk should start screening at an earlier age. Screening tests may include: ? Guaiac-based fecal occult blood testing. ? Fecal immunochemical test (FIT). ? Stool DNA test. ? Virtual colonoscopy. ? Sigmoidoscopy. During this test, a flexible tube with a tiny camera (sigmoidoscope) is used to examine your rectum and lower colon. The sigmoidoscope is inserted through your anus into your rectum and lower colon. ? Colonoscopy. During this test, a long, thin, flexible tube with a tiny camera (colonoscope) is used to examine your entire colon and rectum.  Hepatitis C blood test.  Hepatitis B blood test.  Sexually transmitted disease (STD) testing.  Diabetes screening. This is done by checking your blood sugar (glucose) after you have not eaten for a while (fasting). You may have this done every 1-3 years.  Mammogram. This may be done every 1-2 years. Talk to your health care provider about when you should start having regular mammograms. This may depend on whether you have a family history of breast cancer.  BRCA-related cancer screening. This may be done if you have a family history of breast, ovarian, tubal, or peritoneal cancers.  Pelvic exam  and Pap test. This may be done every 3 years starting at age 23. Starting at age 91, this may be done every 5 years if you have a Pap test in combination with an HPV test.  Bone density scan. This is done to screen for osteoporosis. You may have this scan if you are at high risk for osteoporosis. Discuss your test  results, treatment options, and if necessary, the need for more tests with your health care provider. Vaccines Your health care provider may recommend certain vaccines, such as:  Influenza vaccine. This is recommended every year.  Tetanus, diphtheria, and acellular pertussis (Tdap, Td) vaccine. You may need a Td booster every 10 years.  Varicella vaccine. You may need this if you have not been vaccinated.  Zoster vaccine. You may need this after age 76.  Measles, mumps, and rubella (MMR) vaccine. You may need at least one dose of MMR if you were born in 1957 or later. You may also need a second dose.  Pneumococcal 13-valent conjugate (PCV13) vaccine. You may need this if you have certain conditions and were not previously vaccinated.  Pneumococcal polysaccharide (PPSV23) vaccine. You may need one or two doses if you smoke cigarettes or if you have certain conditions.  Meningococcal vaccine. You may need this if you have certain conditions.  Hepatitis A vaccine. You may need this if you have certain conditions or if you travel or work in places where you may be exposed to hepatitis A.  Hepatitis B vaccine. You may need this if you have certain conditions or if you travel or work in places where you may be exposed to hepatitis B.  Haemophilus influenzae type b (Hib) vaccine. You may need this if you have certain conditions. Talk to your health care provider about which screenings and vaccines you need and how often you need them. This information is not intended to replace advice given to you by your health care provider. Make sure you discuss any questions you have with your health care provider. Document Released: 05/29/2015 Document Revised: 06/22/2017 Document Reviewed: 03/03/2015 Elsevier Interactive Patient Education  2019 [employer].

## 2018-10-26 NOTE — Progress Notes (Signed)
Subjective:    Patient ID: Nichole Mcmahon, female    DOB: Apr 21, 1971, 48 y.o.   MRN: [id number]  HPI  Nichole Mcmahon is a 48 year old female who presents today for complete physical.  Immunizations: -Tetanus: Completed in 2015 -Influenza: Completed last season    Diet: She endorses a healthy diet. She doesn't eat red meat or pork. Mostly eats lean protein, vegetables, veggie pizza. Drinks coffee, water mostly. Exercise: She is doing light weights, stretching, swimming  Eye exam: Completed in Summer 2019 Dental exam: Completes semi-annually  Pap Smear: Completed in 2019, negative Mammogram: Completed in 2019   Review of Systems  Constitutional: Negative for unexpected weight change.  HENT: Negative for rhinorrhea.   Respiratory: Negative for cough and shortness of breath.   Cardiovascular: Negative for chest pain.  Gastrointestinal: Negative for constipation and diarrhea.  Genitourinary: Negative for difficulty urinating.  Musculoskeletal: Negative for arthralgias.  Skin: Negative for rash.  Allergic/Immunologic: Negative for environmental allergies.  Neurological: Negative for dizziness, numbness and headaches.  Psychiatric/Behavioral: The patient is not nervous/anxious.        Past Medical History:  Diagnosis Date  . Depression   . History of eye infection   . Hx gestational diabetes   . Hyperlipidemia      Social History   Socioeconomic History  . Marital status: Married    Spouse name: Not on file  . Number of children: Not on file  . Years of education: Not on file  . Highest education level: Not on file  Occupational History  . Not on file  Social Needs  . Financial resource strain: Not on file  . Food insecurity    Worry: Not on file    Inability: Not on file  . Transportation needs    Medical: Not on file    Non-medical: Not on file  Tobacco Use  . Smoking status: Never Smoker  . Smokeless tobacco: Never Used  Substance and Sexual Activity  .  Alcohol use: No    Alcohol/week: 0.0 standard drinks  . Drug use: No  . Sexual activity: Yes    Birth control/protection: Inserts    Comment: DES NEG, DECLINED INSURANCE QUESTIONS  Lifestyle  . Physical activity    Days per week: Not on file    Minutes per session: Not on file  . Stress: Not on file  Relationships  . Social Herbalist on phone: Not on file    Gets together: Not on file    Attends religious service: Not on file    Active member of club or organization: Not on file    Attends meetings of clubs or organizations: Not on file    Relationship status: Not on file  . Intimate partner violence    Fear of current or ex partner: Not on file    Emotionally abused: Not on file    Physically abused: Not on file    Forced sexual activity: Not on file  Other Topics Concern  . Not on file  Social History Narrative   Married.   1 son.   Works for a [employer].   Enjoys swimming, going to the beach.     Past Surgical History:  Procedure Laterality Date  . FEMUR IM NAIL Right 12/21/2017   Procedure: INTRAMEDULLARY (IM) NAIL FEMORAL;  Surgeon: Shona Needles, MD;  Location: [facility];  Service: Orthopedics;  Laterality: Right;  . RHINOPLASTY  2007    Family History  Problem Relation Age of Onset  . Diabetes Mother   . Hypertension Mother   . Other Mother        kidney transplant due to HTN  . Diabetes Maternal Grandmother   . Hypertension Maternal Grandmother   . Heart disease Maternal Grandmother   . Heart disease Maternal Grandfather     No Known Allergies  Current Outpatient Medications on File Prior to Visit  Medication Sig Dispense Refill  . b complex vitamins capsule Take 1 capsule by mouth daily.    . Calcium Carbonate-Vitamin D (CALCIUM + D PO) Take 1 tablet by mouth daily.     . Cholecalciferol (VITAMIN D3 PO) Take by mouth.    . esomeprazole (NEXIUM) 40 MG capsule Take 40 mg by mouth daily before breakfast.      . etonogestrel-ethinyl  estradiol (NUVARING) 0.12-0.015 MG/24HR vaginal ring Insert vaginally and leave in place for 3 consecutive weeks, then remove for 1 week. 3 each 4  . multivitamin (THERAGRAN) per tablet Take 1 tablet by mouth daily.      . Omega-3 Fatty Acids (FISH OIL OMEGA-3 PO) Take 1 capsule by mouth daily.      No current facility-administered medications on file prior to visit.     BP 110/64   Pulse 72   Temp 97.9 F (36.6 C) (Tympanic)   Ht 5\' 3"  (1.6 m)   Wt 109 lb 8 oz (49.7 kg)   LMP 10/14/2018   SpO2 98%   BMI 19.40 kg/m    Objective:   Physical Exam  Constitutional: She is oriented to person, place, and time. She appears well-nourished.  HENT:  Mouth/Throat: No oropharyngeal exudate.  Eyes: Pupils are equal, round, and reactive to light. EOM are normal.  Neck: Neck supple. No thyromegaly present.  Cardiovascular: Normal rate and regular rhythm.  Respiratory: Effort normal and breath sounds normal.  GI: Soft. Bowel sounds are normal. There is no abdominal tenderness.  Musculoskeletal: Normal range of motion.  Neurological: She is alert and oriented to person, place, and time.  Skin: Skin is warm and dry.  Psychiatric: She has a normal mood and affect.           Assessment & Plan:

## 2018-10-26 NOTE — Assessment & Plan Note (Signed)
Immunizations UTD. Mammogram UTD. Pap smear UTD. Bone density with mild osteopenia. Exam today unremarkable. Labs pending. Follow up in 1 year for CPE.

## 2018-11-23 ENCOUNTER — Encounter: Payer: Commercial Managed Care - PPO | Admitting: Primary Care

## 2018-11-29 ENCOUNTER — Telehealth: Payer: Self-pay | Admitting: Primary Care

## 2018-11-29 DIAGNOSIS — Z20828 Contact with and (suspected) exposure to other viral communicable diseases: Secondary | ICD-10-CM

## 2018-11-29 DIAGNOSIS — Z20822 Contact with and (suspected) exposure to covid-19: Secondary | ICD-10-CM

## 2018-11-29 NOTE — Telephone Encounter (Signed)
Spoken to patient and gave the testing site information. Patient verbalized understanding.

## 2018-11-29 NOTE — Telephone Encounter (Signed)
Noted. Covid-19 testing order placed. Nichole Mcmahon, please give her information on testing sites.

## 2018-11-29 NOTE — Telephone Encounter (Signed)
Patient stated she was exposed to someone who tested positive for COVID .  Patient stated that in order for her to return to work she is needing to be tested,    [facility] # [phone]

## 2018-11-30 ENCOUNTER — Other Ambulatory Visit: Payer: Self-pay

## 2018-11-30 DIAGNOSIS — Z20822 Contact with and (suspected) exposure to covid-19: Secondary | ICD-10-CM

## 2018-12-02 LAB — NOVEL CORONAVIRUS, NAA: SARS-CoV-2, NAA: NOT DETECTED

## 2019-02-06 ENCOUNTER — Encounter: Payer: Self-pay | Admitting: Gynecology

## 2019-03-11 ENCOUNTER — Other Ambulatory Visit: Payer: Self-pay

## 2019-03-12 ENCOUNTER — Ambulatory Visit (INDEPENDENT_AMBULATORY_CARE_PROVIDER_SITE_OTHER): Payer: Commercial Managed Care - PPO | Admitting: Women's Health

## 2019-03-12 ENCOUNTER — Encounter: Payer: Self-pay | Admitting: Women's Health

## 2019-03-12 DIAGNOSIS — Z01419 Encounter for gynecological examination (general) (routine) without abnormal findings: Secondary | ICD-10-CM

## 2019-03-12 DIAGNOSIS — Z3044 Encounter for surveillance of vaginal ring hormonal contraceptive device: Secondary | ICD-10-CM

## 2019-03-12 MED ORDER — ETONOGESTREL-ETHINYL ESTRADIOL 0.12-0.015 MG/24HR VA RING: VAGINAL_RING | VAGINAL | 4 refills | Status: DC

## 2019-03-12 NOTE — Patient Instructions (Addendum)
Colonoscopy at 29  [facility]  [phone]  Dr Lytle Butte to see you today  Health Maintenance, Female Adopting a healthy lifestyle and getting preventive care are important in promoting health and wellness. Ask your health care provider about:  The right schedule for you to have regular tests and exams.  Things you can do on your own to prevent diseases and keep yourself healthy. What should I know about diet, weight, and exercise? Eat a healthy diet   Eat a diet that includes plenty of vegetables, fruits, low-fat dairy products, and lean protein.  Do not eat a lot of foods that are high in solid fats, added sugars, or sodium. Maintain a healthy weight Body mass index (BMI) is used to identify weight problems. It estimates body fat based on height and weight. Your health care provider can help determine your BMI and help you achieve or maintain a healthy weight. Get regular exercise Get regular exercise. This is one of the most important things you can do for your health. Most adults should:  Exercise for at least 150 minutes each week. The exercise should increase your heart rate and make you sweat (moderate-intensity exercise).  Do strengthening exercises at least twice a week. This is in addition to the moderate-intensity exercise.  Spend less time sitting. Even light physical activity can be beneficial. Watch cholesterol and blood lipids Have your blood tested for lipids and cholesterol at 49 years of age, then have this test every 5 years. Have your cholesterol levels checked more often if:  Your lipid or cholesterol levels are high.  You are older than 49 years of age.  You are at high risk for heart disease. What should I know about cancer screening? Depending on your health history and family history, you may need to have cancer screening at various ages. This may include screening for:  Breast cancer.  Cervical cancer.  Colorectal cancer.  Skin cancer.  Lung  cancer. What should I know about heart disease, diabetes, and high blood pressure? Blood pressure and heart disease  High blood pressure causes heart disease and increases the risk of stroke. This is more likely to develop in people who have high blood pressure readings, are of African descent, or are overweight.  Have your blood pressure checked: ? Every 3-5 years if you are 32-47 years of age. ? Every year if you are 83 years old or older. Diabetes Have regular diabetes screenings. This checks your fasting blood sugar level. Have the screening done:  Once every three years after age 17 if you are at a normal weight and have a low risk for diabetes.  More often and at a younger age if you are overweight or have a high risk for diabetes. What should I know about preventing infection? Hepatitis B If you have a higher risk for hepatitis B, you should be screened for this virus. Talk with your health care provider to find out if you are at risk for hepatitis B infection. Hepatitis C Testing is recommended for:  Everyone born from [birth year] through 1966.  Anyone with known risk factors for hepatitis C. Sexually transmitted infections (STIs)  Get screened for STIs, including gonorrhea and chlamydia, if: ? You are sexually active and are younger than 49 years of age. ? You are older than 49 years of age and your health care provider tells you that you are at risk for this type of infection. ? Your sexual activity has changed since you  were last screened, and you are at increased risk for chlamydia or gonorrhea. Ask your health care provider if you are at risk.  Ask your health care provider about whether you are at high risk for HIV. Your health care provider may recommend a prescription medicine to help prevent HIV infection. If you choose to take medicine to prevent HIV, you should first get tested for HIV. You should then be tested every 3 months for as long as you are taking the medicine.  Pregnancy  If you are about to stop having your period (premenopausal) and you may become pregnant, seek counseling before you get pregnant.  Take 400 to 800 micrograms (mcg) of folic acid every day if you become pregnant.  Ask for birth control (contraception) if you want to prevent pregnancy. Osteoporosis and menopause Osteoporosis is a disease in which the bones lose minerals and strength with aging. This can result in bone fractures. If you are 32 years old or older, or if you are at risk for osteoporosis and fractures, ask your health care provider if you should:  Be screened for bone loss.  Take a calcium or vitamin D supplement to lower your risk of fractures.  Be given hormone replacement therapy (HRT) to treat symptoms of menopause. Follow these instructions at home: Lifestyle  Do not use any products that contain nicotine or tobacco, such as cigarettes, e-cigarettes, and chewing tobacco. If you need help quitting, ask your health care provider.  Do not use street drugs.  Do not share needles.  Ask your health care provider for help if you need support or information about quitting drugs. Alcohol use  Do not drink alcohol if: ? Your health care provider tells you not to drink. ? You are pregnant, may be pregnant, or are planning to become pregnant.  If you drink alcohol: ? Limit how much you use to 0-1 drink a day. ? Limit intake if you are breastfeeding.  Be aware of how much alcohol is in your drink. In the [place] one drink equals one 12 oz bottle of beer (355 mL), one 5 oz glass of wine (148 mL), or one 1 oz glass of hard liquor (44 mL). General instructions  Schedule regular health, dental, and eye exams.  Stay current with your vaccines.  Tell your health care provider if: ? You often feel depressed. ? You have ever been abused or do not feel safe at home. Summary  Adopting a healthy lifestyle and getting preventive care are important in promoting health  and wellness.  Follow your health care provider's instructions about healthy diet, exercising, and getting tested or screened for diseases.  Follow your health care provider's instructions on monitoring your cholesterol and blood pressure. This information is not intended to replace advice given to you by your health care provider. Make sure you discuss any questions you have with your health care provider. Document Released: 11/15/2010 Document Revised: 04/25/2018 Document Reviewed: 04/25/2018 Elsevier Patient Education  2020 [employer].

## 2019-03-12 NOTE — Progress Notes (Signed)
Nichole Mcmahon 08-15-1970 [id number]    History:    Presents for annual exam.  NuvaRing continuously amenorrheic.  Normal Pap and mammogram history.  2019 hip fracture from traumatic fall in the shower with normal bone density after.  History of anxiety, no medication doing well.  Past medical history, past surgical history, family history and social history were all reviewed and documented in the EPIC chart.  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  Mother diabetes, hypertension has had a kidney transplant doing fair, father deceased from heart disease.  Son 24 living in [place] doing well.  ROS:  A ROS was performed and pertinent positives and negatives are included.  Exam:  Vitals:   03/12/19 1208  BP: 110/78  Weight: 111 lb (50.3 kg)  Height: 5\' 3"  (1.6 m)   Body mass index is 19.66 kg/m.   General appearance:  Normal Thyroid:  Symmetrical, normal in size, without palpable masses or nodularity. Respiratory  Auscultation:  Clear without wheezing or rhonchi Cardiovascular  Auscultation:  Regular rate, without rubs, murmurs or gallops  Edema/varicosities:  Not grossly evident Abdominal  Soft,nontender, without masses, guarding or rebound.  Liver/spleen:  No organomegaly noted  Hernia:  None appreciated  Skin  Inspection:  Grossly normal   Breasts: Examined lying and sitting.     Right: Without masses, retractions, discharge or axillary adenopathy.     Left: Without masses, retractions, discharge or axillary adenopathy. Gentitourinary   Inguinal/mons:  Normal without inguinal adenopathy  External genitalia:  Normal  BUS/Urethra/Skene's glands:  Normal  Vagina:  Normal  Cervix:  Normal  Uterus:   normal in size, shape and contour.  Midline and mobile  Adnexa/parametria:     Rt: Without masses or tenderness.   Lt: Without masses or tenderness.  Anus and perineum: Normal  Digital rectal exam: Normal sphincter tone without palpated masses or tenderness  Assessment/Plan:  48  y.o. MWF G1, P1 for annual exam with no complaints.  NuvaRing continuously/amenorrheic Labs-primary care reports as normal  Plan: SBEs, continue annual screening mammogram, calcium rich foods, vitamin D 2000 daily encouraged.  Home safety, fall prevention and importance of weightbearing exercise reviewed.  Screening colonoscopy at age 57 reviewed.  Pap normal 2019, new screening guidelines reviewed.    [name], 1:19 PM 03/12/2019

## 2019-04-15 ENCOUNTER — Other Ambulatory Visit: Payer: Self-pay | Admitting: Gynecology

## 2019-04-15 DIAGNOSIS — Z1231 Encounter for screening mammogram for malignant neoplasm of breast: Secondary | ICD-10-CM

## 2019-06-07 ENCOUNTER — Ambulatory Visit
Admission: RE | Admit: 2019-06-07 | Discharge: 2019-06-07 | Disposition: A | Discharge status: Home / Self Care | Payer: Commercial Managed Care - PPO | Source: Ambulatory Visit | Attending: Gynecology | Admitting: Gynecology

## 2019-06-07 ENCOUNTER — Other Ambulatory Visit: Payer: Self-pay

## 2019-06-07 DIAGNOSIS — Z1231 Encounter for screening mammogram for malignant neoplasm of breast: Secondary | ICD-10-CM

## 2019-06-19 NOTE — Progress Notes (Signed)
Normal results noted.

## 2019-11-01 ENCOUNTER — Other Ambulatory Visit: Payer: Self-pay

## 2019-11-01 ENCOUNTER — Ambulatory Visit (INDEPENDENT_AMBULATORY_CARE_PROVIDER_SITE_OTHER): Payer: Commercial Managed Care - PPO | Admitting: Primary Care

## 2019-11-01 ENCOUNTER — Encounter: Payer: Self-pay | Admitting: Primary Care

## 2019-11-01 VITALS — BP 104/70 | HR 78 | Temp 96.1°F | Ht 63.0 in | Wt 112.8 lb

## 2019-11-01 DIAGNOSIS — Z1211 Encounter for screening for malignant neoplasm of colon: Secondary | ICD-10-CM | POA: Diagnosis not present

## 2019-11-01 DIAGNOSIS — Z1159 Encounter for screening for other viral diseases: Secondary | ICD-10-CM

## 2019-11-01 DIAGNOSIS — Z114 Encounter for screening for human immunodeficiency virus [HIV]: Secondary | ICD-10-CM

## 2019-11-01 DIAGNOSIS — Z0000 Encounter for general adult medical examination without abnormal findings: Secondary | ICD-10-CM

## 2019-11-01 DIAGNOSIS — K219 Gastro-esophageal reflux disease without esophagitis: Secondary | ICD-10-CM

## 2019-11-01 LAB — COMPREHENSIVE METABOLIC PANEL
ALT: 11 U/L (ref 0–35)
AST: 12 U/L (ref 0–37)
Albumin: 4.5 g/dL (ref 3.5–5.2)
Alkaline Phosphatase: 39 U/L (ref 39–117)
BUN: 14 mg/dL (ref 6–23)
CO2: 26 mEq/L (ref 19–32)
Calcium: 9.8 mg/dL (ref 8.4–10.5)
Chloride: 105 mEq/L (ref 96–112)
Creatinine, Ser: 0.81 mg/dL (ref 0.40–1.20)
GFR: 74.92 mL/min (ref >60.00)
Glucose, Bld: 93 mg/dL (ref 70–99)
Potassium: 4.4 mEq/L (ref 3.5–5.1)
Sodium: 137 mEq/L (ref 135–145)
Total Bilirubin: 0.5 mg/dL (ref 0.2–1.2)
Total Protein: 7.6 g/dL (ref 6.0–8.3)

## 2019-11-01 LAB — LIPID PANEL
Cholesterol: 190 mg/dL (ref 0–200)
HDL: 56.4 mg/dL (ref >39.00)
LDL Cholesterol: 105 mg/dL — ABNORMAL HIGH (ref 0–99)
NonHDL: 133.92
Total CHOL/HDL Ratio: 3
Triglycerides: 143 mg/dL (ref 0.0–149.0)
VLDL: 28.6 mg/dL (ref 0.0–40.0)

## 2019-11-01 LAB — CBC
HCT: 41.1 % (ref 36.0–46.0)
Hemoglobin: 14 g/dL (ref 12.0–15.0)
MCHC: 34.1 g/dL (ref 30.0–36.0)
MCV: 91.8 fl (ref 78.0–100.0)
Platelets: 324 10*3/uL (ref 150.0–400.0)
RBC: 4.48 Mil/uL (ref 3.87–5.11)
RDW: 12.8 % (ref 11.5–15.5)
WBC: 10.1 10*3/uL (ref 4.0–10.5)

## 2019-11-01 NOTE — Assessment & Plan Note (Signed)
Doing well on Nexium daily, continue same.

## 2019-11-01 NOTE — Patient Instructions (Signed)
Stop by the lab prior to leaving today. I will notify you of your results once received.   You will be contacted regarding your referral to GI for the colonoscopy.  Please let us know if you have not been contacted within two weeks.   Continue exercising. You should be getting 150 minutes of moderate intensity exercise weekly.  Continue to work on a healthy diet. Ensure you are consuming 64 ounces of water daily.  It was a pleasure to see you today!   Preventive Care 29-50 Years Old, Female Preventive care refers to visits with your health care provider and lifestyle choices that can promote health and wellness. This includes:  A yearly physical exam. This may also be called an annual well check.  Regular dental visits and eye exams.  Immunizations.  Screening for certain conditions.  Healthy lifestyle choices, such as eating a healthy diet, getting regular exercise, not using drugs or products that contain nicotine and tobacco, and limiting alcohol use. What can I expect for my preventive care visit? Physical exam Your health care provider will check your:  Height and weight. This may be used to calculate body mass index (BMI), which tells if you are at a healthy weight.  Heart rate and blood pressure.  Skin for abnormal spots. Counseling Your health care provider may ask you questions about your:  Alcohol, tobacco, and drug use.  Emotional well-being.  Home and relationship well-being.  Sexual activity.  Eating habits.  Work and work Statistician.  Method of birth control.  Menstrual cycle.  Pregnancy history. What immunizations do I need?  Influenza (flu) vaccine  This is recommended every year. Tetanus, diphtheria, and pertussis (Tdap) vaccine  You may need a Td booster every 10 years. Varicella (chickenpox) vaccine  You may need this if you have not been vaccinated. Zoster (shingles) vaccine  You may need this after age 30. Measles, mumps, and  rubella (MMR) vaccine  You may need at least one dose of MMR if you were born in 1957 or later. You may also need a second dose. Pneumococcal conjugate (PCV13) vaccine  You may need this if you have certain conditions and were not previously vaccinated. Pneumococcal polysaccharide (PPSV23) vaccine  You may need one or two doses if you smoke cigarettes or if you have certain conditions. Meningococcal conjugate (MenACWY) vaccine  You may need this if you have certain conditions. Hepatitis A vaccine  You may need this if you have certain conditions or if you travel or work in places where you may be exposed to hepatitis A. Hepatitis B vaccine  You may need this if you have certain conditions or if you travel or work in places where you may be exposed to hepatitis B. Haemophilus influenzae type b (Hib) vaccine  You may need this if you have certain conditions. Human papillomavirus (HPV) vaccine  If recommended by your health care provider, you may need three doses over 6 months. You may receive vaccines as individual doses or as more than one vaccine together in one shot (combination vaccines). Talk with your health care provider about the risks and benefits of combination vaccines. What tests do I need? Blood tests  Lipid and cholesterol levels. These may be checked every 5 years, or more frequently if you are over 38 years old.  Hepatitis C test.  Hepatitis B test. Screening  Lung cancer screening. You may have this screening every year starting at age 89 if you have a 30-pack-year history of smoking  and currently smoke or have quit within the past 15 years.  Colorectal cancer screening. All adults should have this screening starting at age 82 and continuing until age 32. Your health care provider may recommend screening at age 50 if you are at increased risk. You will have tests every 1-10 years, depending on your results and the type of screening test.  Diabetes screening. This  is done by checking your blood sugar (glucose) after you have not eaten for a while (fasting). You may have this done every 1-3 years.  Mammogram. This may be done every 1-2 years. Talk with your health care provider about when you should start having regular mammograms. This may depend on whether you have a family history of breast cancer.  BRCA-related cancer screening. This may be done if you have a family history of breast, ovarian, tubal, or peritoneal cancers.  Pelvic exam and Pap test. This may be done every 3 years starting at age 38. Starting at age 59, this may be done every 5 years if you have a Pap test in combination with an HPV test. Other tests  Sexually transmitted disease (STD) testing.  Bone density scan. This is done to screen for osteoporosis. You may have this scan if you are at high risk for osteoporosis. Follow these instructions at home: Eating and drinking  Eat a diet that includes fresh fruits and vegetables, whole grains, lean protein, and low-fat dairy.  Take vitamin and mineral supplements as recommended by your health care provider.  Do not drink alcohol if: ? Your health care provider tells you not to drink. ? You are pregnant, may be pregnant, or are planning to become pregnant.  If you drink alcohol: ? Limit how much you have to 0-1 drink a day. ? Be aware of how much alcohol is in your drink. In the [place] one drink equals one 12 oz bottle of beer (355 mL), one 5 oz glass of wine (148 mL), or one 1 oz glass of hard liquor (44 mL). Lifestyle  Take daily care of your teeth and gums.  Stay active. Exercise for at least 30 minutes on 5 or more days each week.  Do not use any products that contain nicotine or tobacco, such as cigarettes, e-cigarettes, and chewing tobacco. If you need help quitting, ask your health care provider.  If you are sexually active, practice safe sex. Use a condom or other form of birth control (contraception) in order to prevent  pregnancy and STIs (sexually transmitted infections).  If told by your health care provider, take low-dose aspirin daily starting at age 60. What's next?  Visit your health care provider once a year for a well check visit.  Ask your health care provider how often you should have your eyes and teeth checked.  Stay up to date on all vaccines. This information is not intended to replace advice given to you by your health care provider. Make sure you discuss any questions you have with your health care provider. Document Revised: 01/11/2018 Document Reviewed: 01/11/2018 Elsevier Patient Education  2020 [employer].

## 2019-11-01 NOTE — Assessment & Plan Note (Signed)
Immunizations UTD. Mammogram UTD. Pap smear UTD, due again in 2022. Colonoscopy due, referral placed to GI. Encouraged a healthy diet, regular exercise.  Exam unremarkable. Labs pending.

## 2019-11-01 NOTE — Progress Notes (Signed)
Subjective:    Patient ID: Nichole Mcmahon, female    DOB: 12/02/1970, 49 y.o.   MRN: [id number]  HPI  This visit occurred during the SARS-CoV-2 public health emergency.  Safety protocols were in place, including screening questions prior to the visit, additional usage of staff PPE, and extensive cleaning of exam room while observing appropriate contact time as indicated for disinfecting solutions.   Nichole Mcmahon is a 49 year old female who presents today for complete physical.   Immunizations: -Tetanus: Completed in 2015 -Influenza: Completed last season  -Covid-19: Has not completed  Diet: She endorses a healthy diet. Exercise: Completes several days weekly.   Eye exam: Completed in September 2020, diagnosed with narrow angle glaucoma.  Dental exam: Completes semi-annually   Pap Smear: Completed in 2019 Mammogram: Completed in January 2021 Colonoscopy: Never completed  BP Readings from Last 3 Encounters:  11/01/19 104/70  03/12/19 110/78  10/26/18 110/64      Review of Systems  Constitutional: Negative for unexpected weight change.  HENT: Positive for congestion and rhinorrhea.   Respiratory: Negative for cough and shortness of breath.   Cardiovascular: Negative for chest pain.  Gastrointestinal: Negative for constipation and diarrhea.  Genitourinary: Negative for difficulty urinating.  Musculoskeletal: Negative for arthralgias and myalgias.  Skin: Negative for rash.  Allergic/Immunologic: Positive for environmental allergies.  Neurological: Positive for headaches. Negative for dizziness and numbness.  Psychiatric/Behavioral: The patient is not nervous/anxious.        Past Medical History:  Diagnosis Date  . Closed right hip fracture, initial encounter ([place]) 12/20/2017  . Depression   . Displaced subtrochanteric fracture of right femur, initial encounter for closed fracture ([place]) 12/20/2017  . Glaucoma   . History of eye infection   . Hx gestational diabetes   .  Hyperlipidemia      Social History   Socioeconomic History  . Marital status: Married    Spouse name: Not on file  . Number of children: Not on file  . Years of education: Not on file  . Highest education level: Not on file  Occupational History  . Not on file  Tobacco Use  . Smoking status: Never Smoker  . Smokeless tobacco: Never Used  Vaping Use  . Vaping Use: Never used  Substance and Sexual Activity  . Alcohol use: No    Alcohol/week: 0.0 standard drinks  . Drug use: No  . Sexual activity: Yes    Birth control/protection: Inserts    Comment: DES NEG, DECLINED INSURANCE QUESTIONS  Other Topics Concern  . Not on file  Social History Narrative   Married.   1 son.   Works for a [employer].   Enjoys swimming, going to the beach.    Social Determinants of Health   Financial Resource Strain:   . Difficulty of Paying Living Expenses:   Food Insecurity:   . Worried About Charity fundraiser in the Last Year:   . Arboriculturist in the Last Year:   Transportation Needs:   . Film/video editor (Medical):   Marland Kitchen Lack of Transportation (Non-Medical):   Physical Activity:   . Days of Exercise per Week:   . Minutes of Exercise per Session:   Stress:   . Feeling of Stress :   Social Connections:   . Frequency of Communication with Friends and Family:   . Frequency of Social Gatherings with Friends and Family:   . Attends Religious Services:   . Active Member  of Clubs or Organizations:   . Attends Archivist Meetings:   Marland Kitchen Marital Status:   Intimate Partner Violence:   . Fear of Current or Ex-Partner:   . Emotionally Abused:   Marland Kitchen Physically Abused:   . Sexually Abused:     Past Surgical History:  Procedure Laterality Date  . FEMUR IM NAIL Right 12/21/2017   Procedure: INTRAMEDULLARY (IM) NAIL FEMORAL;  Surgeon: Shona Needles, MD;  Location: [facility];  Service: Orthopedics;  Laterality: Right;  . RHINOPLASTY  2007    Family History  Problem  Relation Age of Onset  . Diabetes Mother   . Hypertension Mother   . Other Mother        kidney transplant due to HTN  . Diabetes Maternal Grandmother   . Hypertension Maternal Grandmother   . Heart disease Maternal Grandmother   . Heart disease Maternal Grandfather     No Known Allergies  Current Outpatient Medications on File Prior to Visit  Medication Sig Dispense Refill  . aspirin EC 81 MG tablet Take 81 mg by mouth daily. Swallow whole.    . b complex vitamins capsule Take 1 capsule by mouth daily.    . Calcium Carbonate-Vitamin D (CALCIUM + D PO) Take 1 tablet by mouth daily.     . Cholecalciferol (VITAMIN D3 PO) Take by mouth.    . esomeprazole (NEXIUM) 40 MG capsule Take 40 mg by mouth daily before breakfast.      . etonogestrel-ethinyl estradiol (NUVARING) 0.12-0.015 MG/24HR vaginal ring Insert vaginally and leave in place for 3 consecutive weeks, then remove for 1 week. 3 each 4  . multivitamin (THERAGRAN) per tablet Take 1 tablet by mouth daily.      . Omega-3 Fatty Acids (FISH OIL OMEGA-3 PO) Take 1 capsule by mouth daily.      No current facility-administered medications on file prior to visit.    BP 104/70   Pulse 78   Temp (!) 96.1 F (35.6 C) (Temporal)   Ht 5\' 3"  (1.6 m)   Wt 112 lb 12 oz (51.1 kg)   LMP 10/20/2019   SpO2 100%   BMI 19.97 kg/m    Objective:   Physical Exam  Constitutional: She is oriented to person, place, and time.  HENT:  Right Ear: Tympanic membrane and ear canal normal.  Left Ear: Tympanic membrane and ear canal normal.  Eyes: Pupils are equal, round, and reactive to light.  Cardiovascular: Normal rate and regular rhythm.  Respiratory: Effort normal and breath sounds normal.  GI: Soft. Bowel sounds are normal. There is no abdominal tenderness.  Musculoskeletal:        General: Normal range of motion.     Cervical back: Neck supple.  Neurological: She is alert and oriented to person, place, and time. No cranial nerve deficit.    Reflex Scores:      Patellar reflexes are 2+ on the right side and 2+ on the left side. Skin: Skin is warm and dry.  Psychiatric: Mood normal.           Assessment & Plan:

## 2019-11-04 LAB — HEPATITIS C ANTIBODY
Hepatitis C Ab: NONREACTIVE
SIGNAL TO CUT-OFF: 0.01 (ref <1.00)

## 2019-11-04 LAB — HIV ANTIBODY (ROUTINE TESTING W REFLEX): HIV 1&2 Ab, 4th Generation: NONREACTIVE

## 2019-12-27 ENCOUNTER — Telehealth: Payer: Self-pay | Admitting: Primary Care

## 2019-12-27 NOTE — Telephone Encounter (Signed)
Okay with me as long as Alma Friendly, NP agrees> patient will need transfer care appointment once approved.

## 2019-12-27 NOTE — Telephone Encounter (Signed)
Patient would like to transfer from [facility] to [facility]. Patient has moved to [facility].  Can patient switch to [facility]?

## 2019-12-30 NOTE — Telephone Encounter (Signed)
Verified that this is OK with [facility]

## 2020-01-17 ENCOUNTER — Encounter: Payer: Self-pay | Admitting: Gastroenterology

## 2020-03-06 ENCOUNTER — Other Ambulatory Visit: Payer: Self-pay

## 2020-03-06 ENCOUNTER — Encounter: Payer: Self-pay | Admitting: Gastroenterology

## 2020-03-06 ENCOUNTER — Ambulatory Visit (AMBULATORY_SURGERY_CENTER): Payer: Self-pay | Admitting: *Deleted

## 2020-03-06 VITALS — Ht 63.0 in | Wt 115.0 lb

## 2020-03-06 DIAGNOSIS — Z1211 Encounter for screening for malignant neoplasm of colon: Secondary | ICD-10-CM

## 2020-03-06 MED ORDER — SUTAB 1479-225-188 MG PO TABS: 1.0000 | ORAL_TABLET | ORAL | 0 refills | Status: DC

## 2020-03-06 NOTE — Progress Notes (Signed)
Patient is here in-person for PV. Patient denies any allergies to eggs or soy. Patient denies any problems with anesthesia/sedation. Patient denies any oxygen use at home. Patient denies taking any diet/weight loss medications or blood thinners. Patient is not being treated for MRSA or C-diff. Patient is aware of our care-partner policy and [id number] safety protocol. EMMI education assisgned to the patient for the procedure, sent to MyChart.   COVID-19 vaccines completed on 01/24/2020, per patient.   Prep Prescription coupon given to the patient.

## 2020-03-20 ENCOUNTER — Other Ambulatory Visit: Payer: Self-pay

## 2020-03-20 ENCOUNTER — Encounter: Payer: Self-pay | Admitting: Gastroenterology

## 2020-03-20 ENCOUNTER — Ambulatory Visit (AMBULATORY_SURGERY_CENTER): Payer: Commercial Managed Care - PPO | Admitting: Gastroenterology

## 2020-03-20 VITALS — BP 107/64 | HR 72 | Temp 97.7°F | Resp 16 | Ht 63.0 in | Wt 115.0 lb

## 2020-03-20 DIAGNOSIS — Z1211 Encounter for screening for malignant neoplasm of colon: Secondary | ICD-10-CM | POA: Diagnosis not present

## 2020-03-20 HISTORY — PX: COLONOSCOPY: SHX174

## 2020-03-20 MED ORDER — SODIUM CHLORIDE 0.9 % IV SOLN: 500.0000 mL | Freq: Once | INTRAVENOUS | Status: DC

## 2020-03-20 NOTE — Progress Notes (Signed)
Pt's states no medical or surgical changes since previsit or office visit.  [employer]

## 2020-03-20 NOTE — Patient Instructions (Signed)
Discharge instructions given. °Handout on Hemorrhoids. °Resume previous medications. °YOU HAD AN ENDOSCOPIC PROCEDURE TODAY AT THE [facility]:   Refer to the procedure report that was given to you for any specific questions about what was found during the examination.  If the procedure report does not answer your questions, please call your gastroenterologist to clarify.  If you requested that your care partner not be given the details of your procedure findings, then the procedure report has been included in a sealed envelope for you to review at your convenience later. ° °YOU SHOULD EXPECT: Some feelings of bloating in the abdomen. Passage of more gas than usual.  Walking can help get rid of the air that was put into your GI tract during the procedure and reduce the bloating. If you had a lower endoscopy (such as a colonoscopy or flexible sigmoidoscopy) you may notice spotting of blood in your stool or on the toilet paper. If you underwent a bowel prep for your procedure, you may not have a normal bowel movement for a few days. ° °Please Note:  You might notice some irritation and congestion in your nose or some drainage.  This is from the oxygen used during your procedure.  There is no need for concern and it should clear up in a day or so. ° °SYMPTOMS TO REPORT IMMEDIATELY: ° °Following lower endoscopy (colonoscopy or flexible sigmoidoscopy): ° Excessive amounts of blood in the stool ° Significant tenderness or worsening of abdominal pains ° Swelling of the abdomen that is new, acute ° Fever of 100°F or higher ° ° °For urgent or emergent issues, a gastroenterologist can be reached at any hour by calling [phone]. °Do not use MyChart messaging for urgent concerns.  ° ° °DIET:  We do recommend a small meal at first, but then you may proceed to your regular diet.  Drink plenty of fluids but you should avoid alcoholic beverages for 24 hours. ° °ACTIVITY:  You should plan to take it easy for the  rest of today and you should NOT DRIVE or use heavy machinery until tomorrow (because of the sedation medicines used during the test).   ° °FOLLOW UP: °Our staff will call the number listed on your records 48-72 hours following your procedure to check on you and address any questions or concerns that you may have regarding the information given to you following your procedure. If we do not reach you, we will leave a message.  We will attempt to reach you two times.  During this call, we will ask if you have developed any symptoms of COVID 19. If you develop any symptoms (ie: fever, flu-like symptoms, shortness of breath, cough etc.) before then, please call [phone].  If you test positive for Covid 19 in the 2 weeks post procedure, please call and report this information to us.   ° °If any biopsies were taken you will be contacted by phone or by letter within the next 1-3 weeks.  Please call us at [phone] if you have not heard about the biopsies in 3 weeks.  ° ° °SIGNATURES/CONFIDENTIALITY: °You and/or your care partner have signed paperwork which will be entered into your electronic medical record.  These signatures attest to the fact that that the information above on your After Visit Summary has been reviewed and is understood.  Full responsibility of the confidentiality of this discharge information lies with you and/or your care-partner.  °

## 2020-03-20 NOTE — Progress Notes (Signed)
To PACU, VSS. Report to [email or web address] 

## 2020-03-20 NOTE — Op Note (Signed)
[facility] Patient Name: Nichole Mcmahon Procedure Date: 03/20/2020 7:45 AM MRN: [id number] Endoscopist: Mauri Pole , MD Age: 49 Referring MD:  Date of Birth: May 15, 1971 Gender: Female Account #: [id number] Procedure:                Colonoscopy Indications:              Screening for colorectal malignant neoplasm Medicines:                Monitored Anesthesia Care Procedure:                Pre-Anesthesia Assessment:                           - Prior to the procedure, a History and Physical                            was performed, and patient medications and                            allergies were reviewed. The patient's tolerance of                            previous anesthesia was also reviewed. The risks                            and benefits of the procedure and the sedation                            options and risks were discussed with the patient.                            All questions were answered, and informed consent                            was obtained. Prior Anticoagulants: The patient has                            taken no previous anticoagulant or antiplatelet                            agents. ASA Grade Assessment: II - A patient with                            mild systemic disease. After reviewing the risks                            and benefits, the patient was deemed in                            satisfactory condition to undergo the procedure.                           After obtaining informed consent, the colonoscope  was passed under direct vision. Throughout the                            procedure, the patient's blood pressure, pulse, and                            oxygen saturations were monitored continuously. The                            Colonoscope was introduced through the anus and                            advanced to the the cecum, identified by                            appendiceal orifice  and ileocecal valve. The                            colonoscopy was performed without difficulty. The                            patient tolerated the procedure well. The quality                            of the bowel preparation was adequate to identify                            polyps 6 mm and larger in size. The ileocecal                            valve, appendiceal orifice, and rectum were                            photographed. Scope In: 8:12:41 AM Scope Out: 8:33:33 AM Scope Withdrawal Time: 0 hours 10 minutes 58 seconds  Total Procedure Duration: 0 hours 20 minutes 52 seconds  Findings:                 The perianal and digital rectal examinations were                            normal.                           Non-bleeding internal hemorrhoids were found during                            retroflexion. The hemorrhoids were medium-sized. Complications:            No immediate complications. Estimated Blood Loss:     Estimated blood loss was minimal. Impression:               - Non-bleeding internal hemorrhoids.                           - No specimens collected. Recommendation:           -  Patient has a contact number available for                            emergencies. The signs and symptoms of potential                            delayed complications were discussed with the                            patient. Return to normal activities tomorrow.                            Written discharge instructions were provided to the                            patient.                           - Resume previous diet.                           - Continue present medications.                           - Repeat colonoscopy in 10 years for screening                            purposes.                           - For future colonoscopy the patient will require                            an extended preparation. If there are any                            questions, please contact the  gastroenterologist. Mauri Pole, MD 03/20/2020 8:38:24 AM This report has been signed electronically.

## 2020-03-23 ENCOUNTER — Other Ambulatory Visit: Payer: Self-pay

## 2020-03-23 MED ORDER — TRETINOIN 0.05 % EX CREA: TOPICAL_CREAM | Freq: Every evening | CUTANEOUS | 0 refills | Status: DC

## 2020-03-23 NOTE — Progress Notes (Signed)
Tretinoin RF until patient gets to yearly appt in February.

## 2020-03-24 ENCOUNTER — Other Ambulatory Visit: Payer: Self-pay

## 2020-03-24 ENCOUNTER — Telehealth: Payer: Self-pay

## 2020-03-24 ENCOUNTER — Encounter: Payer: Self-pay | Admitting: Nurse Practitioner

## 2020-03-24 ENCOUNTER — Ambulatory Visit (INDEPENDENT_AMBULATORY_CARE_PROVIDER_SITE_OTHER): Payer: Commercial Managed Care - PPO | Admitting: Nurse Practitioner

## 2020-03-24 VITALS — BP 118/76 | Ht 63.0 in | Wt 113.0 lb

## 2020-03-24 DIAGNOSIS — Z3044 Encounter for surveillance of vaginal ring hormonal contraceptive device: Secondary | ICD-10-CM | POA: Diagnosis not present

## 2020-03-24 DIAGNOSIS — Z01419 Encounter for gynecological examination (general) (routine) without abnormal findings: Secondary | ICD-10-CM | POA: Diagnosis not present

## 2020-03-24 DIAGNOSIS — M8580 Other specified disorders of bone density and structure, unspecified site: Secondary | ICD-10-CM

## 2020-03-24 MED ORDER — ETONOGESTREL-ETHINYL ESTRADIOL 0.12-0.015 MG/24HR VA RING: VAGINAL_RING | VAGINAL | 4 refills | Status: DC

## 2020-03-24 NOTE — Progress Notes (Signed)
   Nichole Mcmahon May 03, 1971 [id number]   History:  49 y.o. G1P1 presents for annual exam. Light monthly spotting on Nuvaring continuously. Normal pap and mammogram history. 2019 right hip fracture from fall. Osteopenia managed by PCP.   Gynecologic History Patient's last menstrual period was 03/08/2020. Period Cycle (Days): 28 Period Duration (Days): 1 Period Pattern: Regular Menstrual Flow: Light Dysmenorrhea: None Contraception: NuvaRing vaginal inserts Last Pap: 02/27/2018. Results were: normal Last mammogram: 06/10/2019. Results were: normal Last colonoscopy: 03/20/2020. Results were: normal Last Dexa: 04/11/2018. Results were: t-score -2.1  Past medical history, past surgical history, family history and social history were all reviewed and documented in the EPIC chart.  ROS:  A ROS was performed and pertinent positives and negatives are included.  Exam:  Vitals:   03/24/20 1134  BP: 118/76  Weight: 113 lb (51.3 kg)  Height: 5\' 3"  (1.6 m)   Body mass index is 20.02 kg/m.  General appearance:  Normal Thyroid:  Symmetrical, normal in size, without palpable masses or nodularity. Respiratory  Auscultation:  Clear without wheezing or rhonchi Cardiovascular  Auscultation:  Regular rate, without rubs, murmurs or gallops  Edema/varicosities:  Not grossly evident Abdominal  Soft,nontender, without masses, guarding or rebound.  Liver/spleen:  No organomegaly noted  Hernia:  None appreciated  Skin  Inspection:  Grossly normal   Breasts: Examined lying and sitting.   Right: Without masses, retractions, discharge or axillary adenopathy.   Left: Without masses, retractions, discharge or axillary adenopathy. Gentitourinary   Inguinal/mons:  Normal without inguinal adenopathy  External genitalia:  Normal  BUS/Urethra/Skene's glands:  Normal  Vagina:  Normal  Cervix:  Normal, scant amount of blood from menses  Uterus:  Anteverted, normal in size, shape and contour.  Midline  and mobile  Adnexa/parametria:     Rt: Without masses or tenderness.   Lt: Without masses or tenderness.  Anus and perineum: Normal  Digital rectal exam: Deferred. Normal colonoscopy 4 days ago  Assessment/Plan:  49 y.o. G1P1 for annual exam.   Well female exam with routine gynecological exam - Education provided on SBEs, importance of preventative screenings, current guidelines, high calcium diet, regular exercise, and multivitamin daily. Labs with PCP.   Encounter for surveillance of vaginal ring hormonal contraceptive device - Plan: etonogestrel-ethinyl estradiol (NUVARING) 0.12-0.015 MG/24HR vaginal ring. Light monthly spotting. Taking continuously. Refill x 1 year provided.   Osteopenia, unspecified location - 2019 right hip fracture from fall. Bone density afterwards showed mild osteopenia. Mother has osteoporosis. Taking daily vitamin D supplement and doing regular resistance training. Bone density managed by PCP.  Screening for cervical cancer - normal pap history. Will repeat at 5-year interval per guidelines.   Screening for breast cancer - Normal mammogram history. Continue annual screenings. Normal breast exam today.   Screening for colon cancer - Normal screening colonoscopy 4 days ago. 10 year recall.   Follow up in 1 year for annual.    Tamela Gammon [facility], 11:55 AM 03/24/2020

## 2020-03-24 NOTE — Patient Instructions (Signed)
Health Maintenance, Female Adopting a healthy lifestyle and getting preventive care are important in promoting health and wellness. Ask your health care provider about:  The right schedule for you to have regular tests and exams.  Things you can do on your own to prevent diseases and keep yourself healthy. What should I know about diet, weight, and exercise? Eat a healthy diet   Eat a diet that includes plenty of vegetables, fruits, low-fat dairy products, and lean protein.  Do not eat a lot of foods that are high in solid fats, added sugars, or sodium. Maintain a healthy weight Body mass index (BMI) is used to identify weight problems. It estimates body fat based on height and weight. Your health care provider can help determine your BMI and help you achieve or maintain a healthy weight. Get regular exercise Get regular exercise. This is one of the most important things you can do for your health. Most adults should:  Exercise for at least 150 minutes each week. The exercise should increase your heart rate and make you sweat (moderate-intensity exercise).  Do strengthening exercises at least twice a week. This is in addition to the moderate-intensity exercise.  Spend less time sitting. Even light physical activity can be beneficial. Watch cholesterol and blood lipids Have your blood tested for lipids and cholesterol at 50 years of age, then have this test every 5 years. Have your cholesterol levels checked more often if:  Your lipid or cholesterol levels are high.  You are older than 50 years of age.  You are at high risk for heart disease. What should I know about cancer screening? Depending on your health history and family history, you may need to have cancer screening at various ages. This may include screening for:  Breast cancer.  Cervical cancer.  Colorectal cancer.  Skin cancer.  Lung cancer. What should I know about heart disease, diabetes, and high blood  pressure? Blood pressure and heart disease  High blood pressure causes heart disease and increases the risk of stroke. This is more likely to develop in people who have high blood pressure readings, are of African descent, or are overweight.  Have your blood pressure checked: ? Every 3-5 years if you are 18-39 years of age. ? Every year if you are 40 years old or older. Diabetes Have regular diabetes screenings. This checks your fasting blood sugar level. Have the screening done:  Once every three years after age 40 if you are at a normal weight and have a low risk for diabetes.  More often and at a younger age if you are overweight or have a high risk for diabetes. What should I know about preventing infection? Hepatitis B If you have a higher risk for hepatitis B, you should be screened for this virus. Talk with your health care provider to find out if you are at risk for hepatitis B infection. Hepatitis C Testing is recommended for:  Everyone born from 1945 through 1965.  Anyone with known risk factors for hepatitis C. Sexually transmitted infections (STIs)  Get screened for STIs, including gonorrhea and chlamydia, if: ? You are sexually active and are younger than 50 years of age. ? You are older than 50 years of age and your health care provider tells you that you are at risk for this type of infection. ? Your sexual activity has changed since you were last screened, and you are at increased risk for chlamydia or gonorrhea. Ask your health care provider if   you are at risk.  Ask your health care provider about whether you are at high risk for HIV. Your health care provider may recommend a prescription medicine to help prevent HIV infection. If you choose to take medicine to prevent HIV, you should first get tested for HIV. You should then be tested every 3 months for as long as you are taking the medicine. Pregnancy  If you are about to stop having your period (premenopausal) and  you may become pregnant, seek counseling before you get pregnant.  Take 400 to 800 micrograms (mcg) of folic acid every day if you become pregnant.  Ask for birth control (contraception) if you want to prevent pregnancy. Osteoporosis and menopause Osteoporosis is a disease in which the bones lose minerals and strength with aging. This can result in bone fractures. If you are 65 years old or older, or if you are at risk for osteoporosis and fractures, ask your health care provider if you should:  Be screened for bone loss.  Take a calcium or vitamin D supplement to lower your risk of fractures.  Be given hormone replacement therapy (HRT) to treat symptoms of menopause. Follow these instructions at home: Lifestyle  Do not use any products that contain nicotine or tobacco, such as cigarettes, e-cigarettes, and chewing tobacco. If you need help quitting, ask your health care provider.  Do not use street drugs.  Do not share needles.  Ask your health care provider for help if you need support or information about quitting drugs. Alcohol use  Do not drink alcohol if: ? Your health care provider tells you not to drink. ? You are pregnant, may be pregnant, or are planning to become pregnant.  If you drink alcohol: ? Limit how much you use to 0-1 drink a day. ? Limit intake if you are breastfeeding.  Be aware of how much alcohol is in your drink. In the [place] one drink equals one 12 oz bottle of beer (355 mL), one 5 oz glass of wine (148 mL), or one 1 oz glass of hard liquor (44 mL). General instructions  Schedule regular health, dental, and eye exams.  Stay current with your vaccines.  Tell your health care provider if: ? You often feel depressed. ? You have ever been abused or do not feel safe at home. Summary  Adopting a healthy lifestyle and getting preventive care are important in promoting health and wellness.  Follow your health care provider's instructions about healthy  diet, exercising, and getting tested or screened for diseases.  Follow your health care provider's instructions on monitoring your cholesterol and blood pressure. This information is not intended to replace advice given to you by your health care provider. Make sure you discuss any questions you have with your health care provider. Document Revised: 04/25/2018 Document Reviewed: 04/25/2018 Elsevier Patient Education  2020 [employer]  

## 2020-03-24 NOTE — Telephone Encounter (Signed)
  Follow up Call-  Call back number 03/20/2020  Post procedure Call Back phone  # [phone]  Permission to leave phone message Yes  Some recent data might be hidden     1st follow up call made.  NALM

## 2020-03-24 NOTE — Telephone Encounter (Signed)
  Follow up Call-  Call back number 03/20/2020  Post procedure Call Back phone  # [phone]  Permission to leave phone message Yes  Some recent data might be hidden     Patient questions:  Do you have a fever, pain , or abdominal swelling? No. Pain Score  0 *  Have you tolerated food without any problems? Yes.    Have you been able to return to your normal activities? Yes.    Do you have any questions about your discharge instructions: Diet   No. Medications  No. Follow up visit  No.  Do you have questions or concerns about your Care? No.  Actions: * If pain score is 4 or above: No action needed, pain <4.  1. Have you developed a fever since your procedure? no  2.   Have you had an respiratory symptoms (SOB or cough) since your procedure? no  3.   Have you tested positive for COVID 19 since your procedure no  4.   Have you had any family members/close contacts diagnosed with the COVID 19 since your procedure?  no   If yes to any of these questions please route to Joylene John, RN and Joella Prince, RN

## 2020-05-05 ENCOUNTER — Other Ambulatory Visit: Payer: Self-pay | Admitting: Nurse Practitioner

## 2020-05-05 DIAGNOSIS — Z1231 Encounter for screening mammogram for malignant neoplasm of breast: Secondary | ICD-10-CM

## 2020-06-12 ENCOUNTER — Ambulatory Visit: Payer: Commercial Managed Care - PPO

## 2020-06-26 ENCOUNTER — Ambulatory Visit
Admission: RE | Admit: 2020-06-26 | Discharge: 2020-06-26 | Disposition: A | Discharge status: Home / Self Care | Payer: Commercial Managed Care - PPO | Source: Ambulatory Visit | Attending: Nurse Practitioner | Admitting: Nurse Practitioner

## 2020-06-26 ENCOUNTER — Other Ambulatory Visit: Payer: Self-pay

## 2020-06-26 DIAGNOSIS — Z1231 Encounter for screening mammogram for malignant neoplasm of breast: Secondary | ICD-10-CM

## 2020-07-06 ENCOUNTER — Ambulatory Visit (INDEPENDENT_AMBULATORY_CARE_PROVIDER_SITE_OTHER): Payer: Commercial Managed Care - PPO | Admitting: Dermatology

## 2020-07-06 ENCOUNTER — Other Ambulatory Visit: Payer: Self-pay

## 2020-07-06 DIAGNOSIS — D225 Melanocytic nevi of trunk: Secondary | ICD-10-CM

## 2020-07-06 DIAGNOSIS — L821 Other seborrheic keratosis: Secondary | ICD-10-CM

## 2020-07-06 DIAGNOSIS — Z1283 Encounter for screening for malignant neoplasm of skin: Secondary | ICD-10-CM | POA: Diagnosis not present

## 2020-07-06 DIAGNOSIS — L578 Other skin changes due to chronic exposure to nonionizing radiation: Secondary | ICD-10-CM

## 2020-07-06 DIAGNOSIS — L818 Other specified disorders of pigmentation: Secondary | ICD-10-CM | POA: Diagnosis not present

## 2020-07-06 DIAGNOSIS — D229 Melanocytic nevi, unspecified: Secondary | ICD-10-CM

## 2020-07-06 DIAGNOSIS — L988 Other specified disorders of the skin and subcutaneous tissue: Secondary | ICD-10-CM

## 2020-07-06 DIAGNOSIS — L814 Other melanin hyperpigmentation: Secondary | ICD-10-CM

## 2020-07-06 DIAGNOSIS — L905 Scar conditions and fibrosis of skin: Secondary | ICD-10-CM

## 2020-07-06 DIAGNOSIS — Z808 Family history of malignant neoplasm of other organs or systems: Secondary | ICD-10-CM

## 2020-07-06 DIAGNOSIS — D2271 Melanocytic nevi of right lower limb, including hip: Secondary | ICD-10-CM

## 2020-07-06 DIAGNOSIS — D1800 Hemangioma unspecified site: Secondary | ICD-10-CM

## 2020-07-06 MED ORDER — TRETINOIN 0.025 % EX CREA: TOPICAL_CREAM | Freq: Every evening | CUTANEOUS | 6 refills | Status: DC

## 2020-07-06 NOTE — Patient Instructions (Signed)

## 2020-07-06 NOTE — Progress Notes (Signed)
Follow-Up Visit   Subjective  Nichole Mcmahon is a 50 y.o. female who presents for the following: Total body skin exam (No hx of skin ca, no concerns).   The following portions of the chart were reviewed this encounter and updated as appropriate:       Review of Systems:  No other skin or systemic complaints except as noted in HPI or Assessment and Plan.  Objective  Well appearing patient in no apparent distress; mood and affect are within normal limits.  A full examination was performed including scalp, head, eyes, ears, nose, lips, neck, chest, axillae, abdomen, back, buttocks, bilateral upper extremities, bilateral lower extremities, hands, feet, fingers, toes, fingernails, and toenails. All findings within normal limits unless otherwise noted below.  L spinal mid back  Objective  L spinal mid back: 2.0 x 1.0cm speckled brown patch C shaped- present since childhood  Objective  bil arms: Hypopigmented macules arms  Objective  L pretibial, R hip: Linear and white smooth patch- h/o prior procedures  R ant thigh  Objective  R ant thigh: 3.58mm brown macule  Objective  Head - Anterior (Face): Rhytides and volume loss.    Assessment & Plan    Family history of Melanoma - Mother  Lentigines - Scattered tan macules - Discussed due to sun exposure - Benign, observe - Call for any changes  Seborrheic Keratoses - Stuck-on, waxy, tan-brown papules and plaques  - Discussed benign etiology and prognosis. - Observe - Call for any changes  Melanocytic Nevi - Tan-brown and/or pink-flesh-colored symmetric macules and papules - Benign appearing on exam today - Observation - Call clinic for new or changing moles - Recommend daily use of broad spectrum spf 30+ sunscreen to sun-exposed areas.   Hemangiomas - Red papules - Discussed benign nature - Observe - Call for any changes  Actinic Damage - Chronic, secondary to cumulative UV/sun exposure - diffuse scaly  erythematous macules with underlying dyspigmentation - Recommend daily broad spectrum sunscreen SPF 30+ to sun-exposed areas, reapply every 2 hours as needed.  - Call for new or changing lesions. - chest  Skin cancer screening performed today.   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  Observation.  Call clinic for new or changing moles.  Recommend daily use of broad spectrum spf 30+ sunscreen to sun-exposed areas.    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 Call for new or changing lesions.   Scar L pretibial, R hip  Benign, observe  Nevus R ant thigh  Benign-appearing.  Observation.  Call clinic for new or changing moles.  Recommend daily use of broad spectrum spf 30+ sunscreen to sun-exposed areas.    Elastosis of skin Head - Anterior (Face)  Start Tretinoin 0.025% cr qhs as tolerated D/c Tretinoin 0.05% cr due to excessive dryness  Topical retinoid medications like tretinoin/Retin-A, adapalene/Differin, tazarotene/Fabior, and Epiduo/Epiduo Forte can cause dryness and irritation when first started. Only apply a pea-sized amount to the entire affected area. Avoid applying it around the eyes, edges of mouth and creases at the nose. If you experience irritation, use a good moisturizer first and/or apply the medicine less often. If you are doing well with the medicine, you can increase how often you use it until you are applying every night. Be careful with sun protection while using this medication as it can make you sensitive to the  sun. This medicine should not be used by pregnant women.    tretinoin (RETIN-A) 0.025 % cream - Head - Anterior (Face)  Return in about 1 year (around 07/06/2021) for TBSE, Fhx of Melanoma-mother.   I, Othelia Pulling, RMA, am acting as scribe for Brendolyn Patty, MD . Documentation: I have reviewed the  above documentation for accuracy and completeness, and I agree with the above.  Brendolyn Patty MD

## 2020-07-22 ENCOUNTER — Other Ambulatory Visit: Payer: Self-pay

## 2020-07-22 DIAGNOSIS — L988 Other specified disorders of the skin and subcutaneous tissue: Secondary | ICD-10-CM

## 2020-07-22 MED ORDER — TRETINOIN 0.025 % EX CREA: TOPICAL_CREAM | Freq: Every evening | CUTANEOUS | 6 refills | Status: DC

## 2020-07-22 NOTE — Progress Notes (Signed)
Pharmacy change per patient request

## 2020-10-11 ENCOUNTER — Encounter: Payer: Self-pay | Admitting: Family Medicine

## 2020-11-05 ENCOUNTER — Other Ambulatory Visit: Payer: Self-pay

## 2020-11-06 ENCOUNTER — Telehealth: Payer: Self-pay

## 2020-11-06 ENCOUNTER — Encounter: Payer: Self-pay | Admitting: Family Medicine

## 2020-11-06 ENCOUNTER — Ambulatory Visit: Payer: Commercial Managed Care - PPO | Admitting: Primary Care

## 2020-11-06 ENCOUNTER — Ambulatory Visit (INDEPENDENT_AMBULATORY_CARE_PROVIDER_SITE_OTHER): Payer: Commercial Managed Care - PPO | Admitting: Family Medicine

## 2020-11-06 VITALS — BP 99/63 | HR 74 | Temp 98.6°F | Ht 63.75 in | Wt 111.0 lb

## 2020-11-06 DIAGNOSIS — Z0000 Encounter for general adult medical examination without abnormal findings: Secondary | ICD-10-CM

## 2020-11-06 DIAGNOSIS — Z131 Encounter for screening for diabetes mellitus: Secondary | ICD-10-CM

## 2020-11-06 DIAGNOSIS — M8580 Other specified disorders of bone density and structure, unspecified site: Secondary | ICD-10-CM | POA: Diagnosis not present

## 2020-11-06 DIAGNOSIS — H409 Unspecified glaucoma: Secondary | ICD-10-CM | POA: Insufficient documentation

## 2020-11-06 DIAGNOSIS — E782 Mixed hyperlipidemia: Secondary | ICD-10-CM | POA: Diagnosis not present

## 2020-11-06 DIAGNOSIS — Z7689 Persons encountering health services in other specified circumstances: Secondary | ICD-10-CM

## 2020-11-06 DIAGNOSIS — Z8249 Family history of ischemic heart disease and other diseases of the circulatory system: Secondary | ICD-10-CM

## 2020-11-06 LAB — COMPREHENSIVE METABOLIC PANEL
ALT: 8 U/L (ref 0–35)
AST: 11 U/L (ref 0–37)
Albumin: 4.2 g/dL (ref 3.5–5.2)
Alkaline Phosphatase: 32 U/L — ABNORMAL LOW (ref 39–117)
BUN: 16 mg/dL (ref 6–23)
CO2: 26 mEq/L (ref 19–32)
Calcium: 9.5 mg/dL (ref 8.4–10.5)
Chloride: 103 mEq/L (ref 96–112)
Creatinine, Ser: 0.77 mg/dL (ref 0.40–1.20)
GFR: 89.73 mL/min (ref >60.00)
Glucose, Bld: 82 mg/dL (ref 70–99)
Potassium: 4.4 mEq/L (ref 3.5–5.1)
Sodium: 138 mEq/L (ref 135–145)
Total Bilirubin: 0.4 mg/dL (ref 0.2–1.2)
Total Protein: 6.8 g/dL (ref 6.0–8.3)

## 2020-11-06 LAB — CBC WITH DIFFERENTIAL/PLATELET
Basophils Absolute: 0.1 10*3/uL (ref 0.0–0.1)
Basophils Relative: 0.9 % (ref 0.0–3.0)
Eosinophils Absolute: 0.1 10*3/uL (ref 0.0–0.7)
Eosinophils Relative: 1.1 % (ref 0.0–5.0)
HCT: 39.8 % (ref 36.0–46.0)
Hemoglobin: 13.3 g/dL (ref 12.0–15.0)
Lymphocytes Relative: 27.9 % (ref 12.0–46.0)
Lymphs Abs: 2.5 10*3/uL (ref 0.7–4.0)
MCHC: 33.5 g/dL (ref 30.0–36.0)
MCV: 91.8 fl (ref 78.0–100.0)
Monocytes Absolute: 0.6 10*3/uL (ref 0.1–1.0)
Monocytes Relative: 6.8 % (ref 3.0–12.0)
Neutro Abs: 5.6 10*3/uL (ref 1.4–7.7)
Neutrophils Relative %: 63.3 % (ref 43.0–77.0)
Platelets: 337 10*3/uL (ref 150.0–400.0)
RBC: 4.34 Mil/uL (ref 3.87–5.11)
RDW: 12.9 % (ref 11.5–15.5)
WBC: 8.9 10*3/uL (ref 4.0–10.5)

## 2020-11-06 LAB — LIPID PANEL
Cholesterol: 191 mg/dL (ref 0–200)
HDL: 50.9 mg/dL (ref >39.00)
LDL Cholesterol: 107 mg/dL — ABNORMAL HIGH (ref 0–99)
NonHDL: 139.8
Total CHOL/HDL Ratio: 4
Triglycerides: 162 mg/dL — ABNORMAL HIGH (ref 0.0–149.0)
VLDL: 32.4 mg/dL (ref 0.0–40.0)

## 2020-11-06 LAB — VITAMIN D 25 HYDROXY (VIT D DEFICIENCY, FRACTURES): VITD: >120 ng/mL

## 2020-11-06 LAB — TSH: TSH: 2.61 u[IU]/mL (ref 0.35–4.50)

## 2020-11-06 LAB — HEMOGLOBIN A1C: Hgb A1c MFr Bld: 5.4 % (ref 4.6–6.5)

## 2020-11-06 NOTE — Telephone Encounter (Signed)
CRITICAL VALUE STICKER  CRITICAL VALUE: Vitamin D >120  RECEIVER (on-site recipient of call):Allyson Tineo  DATE & TIME NOTIFIED: 04:37  MESSENGER (representative from lab): Nobie Putnam  MD NOTIFIED: Raoul Pitch  TIME OF NOTIFICATION:04:47

## 2020-11-06 NOTE — Patient Instructions (Signed)
Health Maintenance, Female Adopting a healthy lifestyle and getting preventive care are important in promoting health and wellness. Ask your health care provider about: The right schedule for you to have regular tests and exams. Things you can do on your own to prevent diseases and keep yourself healthy. What should I know about diet, weight, and exercise? Eat a healthy diet  Eat a diet that includes plenty of vegetables, fruits, low-fat dairy products, and lean protein. Do not eat a lot of foods that are high in solid fats, added sugars, or sodium.  Maintain a healthy weight Body mass index (BMI) is used to identify weight problems. It estimates body fat based on height and weight. Your health care provider can help determineyour BMI and help you achieve or maintain a healthy weight. Get regular exercise Get regular exercise. This is one of the most important things you can do for your health. Most adults should: Exercise for at least 150 minutes each week. The exercise should increase your heart rate and make you sweat (moderate-intensity exercise). Do strengthening exercises at least twice a week. This is in addition to the moderate-intensity exercise. Spend less time sitting. Even light physical activity can be beneficial. Watch cholesterol and blood lipids Have your blood tested for lipids and cholesterol at 51 years of age, then havethis test every 5 years. Have your cholesterol levels checked more often if: Your lipid or cholesterol levels are high. You are older than 51 years of age. You are at high risk for heart disease. What should I know about cancer screening? Depending on your health history and family history, you may need to have cancer screening at various ages. This may include screening for: Breast cancer. Cervical cancer. Colorectal cancer. Skin cancer. Lung cancer. What should I know about heart disease, diabetes, and high blood pressure? Blood pressure and heart  disease High blood pressure causes heart disease and increases the risk of stroke. This is more likely to develop in people who have high blood pressure readings, are of African descent, or are overweight. Have your blood pressure checked: Every 3-5 years if you are 18-39 years of age. Every year if you are 40 years old or older. Diabetes Have regular diabetes screenings. This checks your fasting blood sugar level. Have the screening done: Once every three years after age 40 if you are at a normal weight and have a low risk for diabetes. More often and at a younger age if you are overweight or have a high risk for diabetes. What should I know about preventing infection? Hepatitis B If you have a higher risk for hepatitis B, you should be screened for this virus. Talk with your health care provider to find out if you are at risk forhepatitis B infection. Hepatitis C Testing is recommended for: Everyone born from 1945 through 1965. Anyone with known risk factors for hepatitis C. Sexually transmitted infections (STIs) Get screened for STIs, including gonorrhea and chlamydia, if: You are sexually active and are younger than 51 years of age. You are older than 51 years of age and your health care provider tells you that you are at risk for this type of infection. Your sexual activity has changed since you were last screened, and you are at increased risk for chlamydia or gonorrhea. Ask your health care provider if you are at risk. Ask your health care provider about whether you are at high risk for HIV. Your health care provider may recommend a prescription medicine to help   prevent HIV infection. If you choose to take medicine to prevent HIV, you should first get tested for HIV. You should then be tested every 3 months for as long as you are taking the medicine. Pregnancy If you are about to stop having your period (premenopausal) and you may become pregnant, seek counseling before you get  pregnant. Take 400 to 800 micrograms (mcg) of folic acid every day if you become pregnant. Ask for birth control (contraception) if you want to prevent pregnancy. Osteoporosis and menopause Osteoporosis is a disease in which the bones lose minerals and strength with aging. This can result in bone fractures. If you are 65 years old or older, or if you are at risk for osteoporosis and fractures, ask your health care provider if you should: Be screened for bone loss. Take a calcium or vitamin D supplement to lower your risk of fractures. Be given hormone replacement therapy (HRT) to treat symptoms of menopause. Follow these instructions at home: Lifestyle Do not use any products that contain nicotine or tobacco, such as cigarettes, e-cigarettes, and chewing tobacco. If you need help quitting, ask your health care provider. Do not use street drugs. Do not share needles. Ask your health care provider for help if you need support or information about quitting drugs. Alcohol use Do not drink alcohol if: Your health care provider tells you not to drink. You are pregnant, may be pregnant, or are planning to become pregnant. If you drink alcohol: Limit how much you use to 0-1 drink a day. Limit intake if you are breastfeeding. Be aware of how much alcohol is in your drink. In the [place] one drink equals one 12 oz bottle of beer (355 mL), one 5 oz glass of wine (148 mL), or one 1 oz glass of hard liquor (44 mL). General instructions Schedule regular health, dental, and eye exams. Stay current with your vaccines. Tell your health care provider if: You often feel depressed. You have ever been abused or do not feel safe at home. Summary Adopting a healthy lifestyle and getting preventive care are important in promoting health and wellness. Follow your health care provider's instructions about healthy diet, exercising, and getting tested or screened for diseases. Follow your health care provider's  instructions on monitoring your cholesterol and blood pressure. This information is not intended to replace advice given to you by your health care provider. Make sure you discuss any questions you have with your healthcare provider. Document Revised: 04/25/2018 Document Reviewed: 04/25/2018 Elsevier Patient Education  2022 Elsevier Inc.  

## 2020-11-06 NOTE — Progress Notes (Signed)
Patient ID: Nichole Mcmahon, female  DOB: 08-27-1970, 50 y.o.   MRN: [id number] Patient Care Team    Relationship Specialty Notifications Start End  Ma Hillock, DO PCP - General Family Medicine  11/06/20   Tamela Gammon, NP Nurse Practitioner Gynecology  11/06/20   Mauri Pole, MD Consulting Physician Gastroenterology  11/06/20     Chief Complaint  Patient presents with   Establish Care   Annual Exam    Pt is fasting    Subjective: Nichole Mcmahon is a 50 y.o.  female present for TOC/cpe All past medical history, surgical history, allergies, family history, immunizations, medications and social history were updated in the electronic medical record today. All recent labs, ED visits and hospitalizations within the last year were reviewed.  Health maintenance:  Colonoscopy: no fhx. completed 03/20/2020, by Dr. Silverio Decamp, resutls normal. follow up 46yr. Mammogram: completed:06/26/2020, birads 1- ordered by GYN team.  Cervical cancer screening: last pap: 02/27/2018, results: GYN Immunizations: tdap UTD 12/2013, Influenza vaccine (encouraged yearly), declined- shingrix and covid. Ok to provide shingrix by nurse visit if desired.  Infectious disease screening: HIV and Hep C completed DEXA: routine screen at 59 Assistive device: none Oxygen KXF:GHWE Patient has a Dental home. Hospitalizations/ED visits:reviewed  Depression screen [facility] 2/9 11/06/2020  Decreased Interest 0  Down, Depressed, Hopeless 0  PHQ - 2 Score 0   No flowsheet data found.      No flowsheet data found.  Immunization History  Administered Date(s) Administered   Hepatitis B, adult 01/03/2014, 03/07/2014, 07/10/2014   Influenza Inj Mdck Quad Pf 03/01/2017   Influenza Whole 03/17/2010, 02/24/2011   Influenza, Seasonal, Injecte, Preservative Fre 02/23/2018   Influenza,inj,Quad PF,6+ Mos 03/07/2014, 02/23/2018   PPD Test 01/03/2014   Tdap 01/03/2014    No results found.  Past Medical  History:  Diagnosis Date   Blood transfusion without reported diagnosis    Depression    Displaced subtrochanteric fracture of right femur, initial encounter for closed fracture ([place]) 12/20/2017   Glaucoma    Hx gestational diabetes    Hyperlipidemia    Osteopenia    No Known Allergies Past Surgical History:  Procedure Laterality Date   COLONOSCOPY  03/20/2020   K Nandigam   FEMUR IM NAIL Right 12/21/2017   Procedure: INTRAMEDULLARY (IM) NAIL FEMORAL;  Surgeon: Shona Needles, MD;  Location: [facility];  Service: Orthopedics;  Laterality: Right;   RHINOPLASTY  2007   Family History  Problem Relation Age of Onset   Diabetes Mother    Hypertension Mother    Other Mother        kidney transplant due to HTN   Heart disease Brother    Healthy Son    Diabetes Maternal Grandmother    Hypertension Maternal Grandmother    Heart disease Maternal Grandmother    Heart disease Maternal Grandfather    Colon cancer Neg Hx    Colon polyps Neg Hx    Esophageal cancer Neg Hx    Rectal cancer Neg Hx    Stomach cancer Neg Hx    Social History   Social History Narrative   Married.   1 son.   Works for a [employer].   Enjoys swimming, going to the beach.     Allergies as of 11/06/2020   No Known Allergies      Medication List        Accurate as of November 06, 2020  9:12 AM. If you  have any questions, ask your nurse or doctor.          aspirin EC 81 MG tablet Take 81 mg by mouth daily. Swallow whole.   b complex vitamins capsule Take 1 capsule by mouth daily.   CALCIUM + D PO Take 1 tablet by mouth daily.   esomeprazole 40 MG capsule Commonly known as: NEXIUM Take 40 mg by mouth daily before breakfast.   etonogestrel-ethinyl estradiol 0.12-0.015 MG/24HR vaginal ring Commonly known as: NuvaRing Insert vaginally and leave in place for 3 consecutive weeks, then remove for 1 week.   FISH OIL OMEGA-3 PO Take 1 capsule by mouth daily.   fluticasone 50 MCG/ACT nasal  spray Commonly known as: FLONASE Place into both nostrils daily.   GLUCOSAMINE 1500 COMPLEX PO Take by mouth.   loratadine 10 MG tablet Commonly known as: CLARITIN Take 10 mg by mouth daily.   multivitamin per tablet Take 1 tablet by mouth daily.   Sodium Fluoride 5000 PPM 1.1 % Pste Generic drug: Sodium Fluoride See admin instructions.   tretinoin 0.025 % cream Commonly known as: RETIN-A Apply topically at bedtime. qhs to face   TURMERIC PO Take by mouth.   VITAMIN C PO Take by mouth.   VITAMIN D3 PO Take by mouth.        All past medical history, surgical history, allergies, family history, immunizations andmedications were updated in the EMR today and reviewed under the history and medication portions of their EMR.    No results found for this or any previous visit (from the past 2160 hour(s)).  MM DIGITAL SCREENING BILATERAL  Result Date: 07/01/2020 CLINICAL DATA:  Screening. EXAM: DIGITAL SCREENING BILATERAL MAMMOGRAM WITH CAD TECHNIQUE: Bilateral screening digital craniocaudal and mediolateral oblique mammograms were obtained. The images were evaluated with computer-aided detection. COMPARISON:  Previous exam(s). ACR Breast Density Category c: The breast tissue is heterogeneously dense, which may obscure small masses. FINDINGS: There are no findings suspicious for malignancy. IMPRESSION: No mammographic evidence of malignancy. A result letter of this screening mammogram will be mailed directly to the patient. RECOMMENDATION: Screening mammogram in one year. (Code:SM-B-01Y) BI-RADS CATEGORY  1: Negative. Electronically Signed   By: Lajean Manes M.D.   On: 07/01/2020 12:14   ROS: 14 pt review of systems performed and negative (unless mentioned in an HPI)  Objective: BP 99/63   Pulse 74   Temp 98.6 F (37 C) (Oral)   Ht 5' 3.75" (1.619 m)   Wt 111 lb (50.3 kg)   LMP 10/18/2020   SpO2 99%   BMI 19.20 kg/m  Gen: Afebrile. No acute distress. Nontoxic in  appearance, well-developed, well-nourished,  very pleasant female.  HENT: AT. [place]. Bilateral TM visualized and normal in appearance, normal external auditory canal. MMM, no oral lesions, adequate dentition. Bilateral nares within normal limits. Throat without erythema, ulcerations or exudates. no Cough on exam, no hoarseness on exam. Eyes:Pupils Equal Round Reactive to light, Extraocular movements intact,  Conjunctiva without redness, discharge or icterus. Neck/lymp/endocrine: Supple,no lymphadenopathy, no thyromegaly CV: RRR no murmur, no edema, +2/4 P posterior tibialis pulses.  Chest: CTAB, no wheeze, rhonchi or crackles. normal Respiratory effort. good Air movement. Abd: Soft. flat. NTND. BS present. no Masses palpated. No hepatosplenomegaly. No rebound tenderness or guarding. Skin: no rashes, purpura or petechiae. Warm and well-perfused. Skin intact. Neuro/Msk:  Normal gait. PERLA. EOMi. Alert. Oriented x3.  Cranial nerves II through XII intact. Muscle strength 5/5 upper/lower extremity. DTRs equal bilaterally. Psych: Normal affect, dress  and demeanor. Normal speech. Normal thought content and judgment.   Assessment/plan: Nichole Mcmahon is a 50 y.o. female present for TOC/CPE Establishing care with new doctor, encounter for- prior [place] pt.  Osteopenia, unspecified location - VITAMIN D 25 Hydroxy (Vit-D Deficiency, Fractures)  Elevated triglycerides with high cholesterol/FH: heart disease Elevated just above goal a few times over last 5 yrs.  She has a fhx heart disease and is tries to make proper food choices.  - CBC with Differential/Platelet - Comprehensive metabolic panel - Lipid panel - TSH  Diabetes mellitus screening - Hemoglobin A1c  Routine general medical examination at a health care facility Colonoscopy: no fhx. completed 03/20/2020, by Dr. Silverio Decamp, resutls normal. follow up 40yr. Mammogram: completed:06/26/2020, birads 1- ordered by GYN team.  Cervical cancer  screening: last pap: 02/27/2018, results: GYN Immunizations: tdap UTD 12/2013, Influenza vaccine (encouraged yearly), declined- shingrix and covid. Ok to provide shingrix by nurse visit if desired.  Infectious disease screening: HIV and Hep C completed DEXA: routine screen at 59 Patient was encouraged to exercise greater than 150 minutes a week. Patient was encouraged to choose a diet filled with fresh fruits and vegetables, and lean meats. AVS provided to patient today for education/recommendation on gender specific health and safety maintenance. Return in about 1 year (around 11/06/2021) for CPE (30 min).  Orders Placed This Encounter  Procedures   CBC with Differential/Platelet   Comprehensive metabolic panel   Hemoglobin A1c   Lipid panel   TSH   VITAMIN D 25 Hydroxy (Vit-D Deficiency, Fractures)    No orders of the defined types were placed in this encounter.  Referral Orders  No referral(s) requested today     Note is dictated utilizing voice recognition software. Although note has been proof read prior to signing, occasional typographical errors still can be missed. If any questions arise, please do not hesitate to call for verification.  Electronically signed by: Howard Pouch, DO [place]

## 2020-11-09 ENCOUNTER — Telehealth: Payer: Self-pay

## 2020-11-09 DIAGNOSIS — E673 Hypervitaminosis D: Secondary | ICD-10-CM

## 2020-11-09 NOTE — Telephone Encounter (Signed)
Spoke with pt regarding labs and instructions.   

## 2020-11-09 NOTE — Telephone Encounter (Signed)
-----   Message from Ma Hillock, DO sent at 11/09/2020  8:02 AM EDT ----- Please inform patient the following information: Liver, kidney and thyroid functions are normal.   Blood cell counts and electrolytes are normal levels Diabetes screen/A1c is normal Cholesterol panel overall looks good with only mildly elevated triglycerides of 162.  Goal is less than 150.    -increasing fiber in the diet, such as 2 servings of Metamucil or psyllium focuses in on lowering triglycerides.    -she reported taking an omega-3 supplement but unknown dose.  Increasing her dose between 1000-2000 mg of omega-3 daily will also help lower her triglycerides.  Her vitamin D is abnormally high.  She takes a vitamin D supplement.  I would encourage her to decrease her vitamin D supplement by half of what she is currently taking.  Vitamin D is toxic at higher levels such as hers and can cause bladder irritation, mental fog, dehydration, constipation to name a few Supple symptoms.  Goal levels are approximately 50.  She is currently at a level greater than 120  Would encourage her to schedule a lab follow-up in about 8 weeks for repeat levels of vitamin D.  Please schedule and place this order for her. Thanks

## 2020-11-10 ENCOUNTER — Telehealth: Payer: Self-pay | Admitting: Family Medicine

## 2020-11-10 NOTE — Telephone Encounter (Signed)
Patient's "physician results and screening form" completed for patient.  Please contact patient and arrange for either her to pick up or we can mail to her. Placed in [place] work basket Thank you

## 2020-11-11 NOTE — Telephone Encounter (Signed)
Form faxed. Pt aware and copy at [facility] for pick up

## 2020-11-13 ENCOUNTER — Ambulatory Visit: Payer: Commercial Managed Care - PPO | Admitting: Family Medicine

## 2020-11-13 NOTE — Telephone Encounter (Signed)
Pt picked up forms.

## 2021-01-08 ENCOUNTER — Ambulatory Visit (INDEPENDENT_AMBULATORY_CARE_PROVIDER_SITE_OTHER): Payer: Commercial Managed Care - PPO

## 2021-01-08 ENCOUNTER — Telehealth: Payer: Self-pay

## 2021-01-08 ENCOUNTER — Other Ambulatory Visit: Payer: Self-pay

## 2021-01-08 DIAGNOSIS — E673 Hypervitaminosis D: Secondary | ICD-10-CM | POA: Diagnosis not present

## 2021-01-08 LAB — VITAMIN D 25 HYDROXY (VIT D DEFICIENCY, FRACTURES): VITD: 114.45 ng/mL (ref 30.00–100.00)

## 2021-01-08 NOTE — Telephone Encounter (Signed)
CRITICAL VALUE STICKER  CRITICAL VALUE: Vit D 114.45  RECEIVER (on-site recipient of call): Nichole Mcmahon  DATE & TIME NOTIFIED: 01/08/21 4:33PM  MESSENGER (representative from lab): [place]  MD NOTIFIED: PCP, Dr.Kuneff  TIME OF NOTIFICATION: 4:37PM  RESPONSE:  Please review and advise

## 2021-01-08 NOTE — Telephone Encounter (Signed)
Phone note had already been generated.

## 2021-01-12 ENCOUNTER — Encounter: Payer: Self-pay | Admitting: Family Medicine

## 2021-01-12 NOTE — Telephone Encounter (Signed)
Please advise 

## 2021-02-25 ENCOUNTER — Other Ambulatory Visit: Payer: Self-pay

## 2021-02-26 ENCOUNTER — Ambulatory Visit: Payer: Commercial Managed Care - PPO | Admitting: Family Medicine

## 2021-02-26 ENCOUNTER — Encounter: Payer: Self-pay | Admitting: Family Medicine

## 2021-02-26 VITALS — BP 123/66 | HR 79 | Temp 98.3°F | Ht 64.0 in | Wt 115.0 lb

## 2021-02-26 DIAGNOSIS — M8580 Other specified disorders of bone density and structure, unspecified site: Secondary | ICD-10-CM | POA: Diagnosis not present

## 2021-02-26 DIAGNOSIS — E673 Hypervitaminosis D: Secondary | ICD-10-CM | POA: Diagnosis not present

## 2021-02-26 LAB — HEPATIC FUNCTION PANEL
ALT: 9 U/L (ref 0–35)
AST: 12 U/L (ref 0–37)
Albumin: 4.1 g/dL (ref 3.5–5.2)
Alkaline Phosphatase: 34 U/L — ABNORMAL LOW (ref 39–117)
Bilirubin, Direct: 0.1 mg/dL (ref 0.0–0.3)
Total Bilirubin: 0.4 mg/dL (ref 0.2–1.2)
Total Protein: 6.7 g/dL (ref 6.0–8.3)

## 2021-02-26 LAB — VITAMIN D 25 HYDROXY (VIT D DEFICIENCY, FRACTURES): VITD: 82.25 ng/mL (ref 30.00–100.00)

## 2021-02-26 NOTE — Patient Instructions (Signed)
Great to see you today.  I have refilled the medication(s) we provide.   If labs were collected, we will inform you of lab results once received either by echart message or telephone call.   - echart message- for normal results that have been seen by the patient already.   - telephone call: abnormal results or if patient has not viewed results in their echart.  

## 2021-02-26 NOTE — Progress Notes (Signed)
This visit occurred during the SARS-CoV-2 public health emergency.  Safety protocols were in place, including screening questions prior to the visit, additional usage of staff PPE, and extensive cleaning of exam room while observing appropriate contact time as indicated for disinfecting solutions.    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  Patient's vitamin D has been severely elevated above 120 last 2 visits.  Last visit we discontinued all vitamin D supplementation for 3 months and then retesting.  She reports she did stop all vitamin D and calcium supplementation as directed.  She states she had increased her vitamin D dose on her own when COVID started.  Since discontinuing the vitamin D 3 months ago she has noticed she is more mentally clear and less foggy feeling. Depression screen [facility] 2/9 02/26/2021 11/06/2020  Decreased Interest 0 0  Down, Depressed, Hopeless 0 0  PHQ - 2 Score 0 0    No Known Allergies Social History   Social History Narrative   Married.   1 son.   Works for a [employer].   Enjoys swimming, going to the beach.    Past Medical History:  Diagnosis Date   Blood transfusion without reported diagnosis    Depression    Displaced subtrochanteric fracture of right femur, initial encounter for closed fracture ([place]) 12/20/2017   Glaucoma    Hx gestational diabetes    Hyperlipidemia    Osteopenia    Past Surgical History:  Procedure Laterality Date   COLONOSCOPY  03/20/2020   K Nandigam   FEMUR IM NAIL Right  12/21/2017   Procedure: INTRAMEDULLARY (IM) NAIL FEMORAL;  Surgeon: Shona Needles, MD;  Location: [place];  Service: Orthopedics;  Laterality: Right;   RHINOPLASTY  2007   Family History  Problem Relation Age of Onset   Diabetes Mother    Hypertension Mother    Other Mother        kidney transplant due to HTN   Heart disease Brother    Healthy Son    Diabetes Maternal Grandmother    Hypertension Maternal Grandmother    Heart disease Maternal Grandmother    Heart disease Maternal Grandfather    Colon cancer Neg Hx    Colon polyps Neg Hx    Esophageal cancer Neg Hx    Rectal cancer Neg Hx    Stomach cancer Neg Hx    Allergies as of 02/26/2021   No Known Allergies      Medication List        Accurate as of February 26, 2021 11:59 PM. If you have any questions, ask your nurse or doctor.          STOP taking these medications    CALCIUM + D PO Stopped by: Howard Pouch, DO   VITAMIN D3 PO Stopped by: Howard Pouch, DO       TAKE these medications    aspirin EC 81 MG tablet Take 81 mg by mouth daily. Swallow whole.  b complex vitamins capsule Take 1 capsule by mouth daily.   esomeprazole 40 MG capsule Commonly known as: NEXIUM Take 40 mg by mouth daily before breakfast.   etodolac 400 MG tablet Commonly known as: LODINE Take 400 mg by mouth 2 (two) times daily as needed.   etonogestrel-ethinyl estradiol 0.12-0.015 MG/24HR vaginal ring Commonly known as: NuvaRing Insert vaginally and leave in place for 3 consecutive weeks, then remove for 1 week.   FISH OIL OMEGA-3 PO Take 1 capsule by mouth daily.   fluticasone 50 MCG/ACT nasal spray Commonly known as: FLONASE Place into both nostrils daily.   GLUCOSAMINE 1500 COMPLEX PO Take by mouth.   loratadine 10 MG tablet Commonly known as: CLARITIN Take 10 mg by mouth daily.   multivitamin per tablet Take 1 tablet by mouth daily.   Sodium Fluoride 5000 PPM 1.1 % Pste Generic drug: Sodium Fluoride See  admin instructions.   tretinoin 0.025 % cream Commonly known as: RETIN-A Apply topically at bedtime. qhs to face   TURMERIC PO Take by mouth.   VITAMIN C PO Take by mouth.        All past medical history, surgical history, allergies, family history, immunizations andmedications were updated in the EMR today and reviewed under the history and medication portions of their EMR.     ROS: Negative, with the exception of above mentioned in HPI   Objective:  BP 123/66   Pulse 79   Temp 98.3 F (36.8 C) (Oral)   Ht 5\' 4"  (1.626 m)   Wt 115 lb (52.2 kg)   SpO2 99%   BMI 19.74 kg/m  Body mass index is 19.74 kg/m. Gen: Afebrile. No acute distress. Nontoxic in appearance, well developed, well nourished.  Very pleasant female. HENT: AT. [place].  No cough.  No hoarseness. Eyes:Pupils Equal Round Reactive to light, Extraocular movements intact,  Conjunctiva without redness, discharge or icterus. CV: RRR Chest: CTAB, no wheeze or crackles. Good air movement, normal resp effort.  Neuro:  Normal gait. PERLA. EOMi. Alert. Oriented x3  Psych: Normal affect, dress and demeanor. Normal speech. Normal thought content and judgment.  No results found. No results found. No results found for this or any previous visit (from the past 24 hour(s)).  Assessment/Plan: Nichole Mcmahon is a 50 y.o. female present for OV for  Hypervitaminosis D/osteopenia Patient reports improvement in mental clarity since the same vitamin D.  We will collect labs today and guide her on any further supplementation.  Suspect this is secondary to her taking high-dose vitamin D to help prevent COVID.  Once levels are given time to restage she likely will eventually need to restart low-dose vitamin D 600-800 units daily for her osteopenia. DEXA up-to-date 2019 at -1.4, consider repeat 5-10 years. - VITAMIN D 25 Hydroxy (Vit-D Deficiency, Fractures) - PTH, Intact and Calcium - Hepatic function panel   Reviewed  expectations re: course of current medical issues. Discussed self-management of symptoms. Outlined signs and symptoms indicating need for more acute intervention. Patient verbalized understanding and all questions were answered. Patient received an After-Visit Summary.    Orders Placed This Encounter  Procedures   VITAMIN D 25 Hydroxy (Vit-D Deficiency, Fractures)   PTH, Intact and Calcium   Hepatic function panel   No orders of the defined types were placed in this encounter.  Referral Orders  No referral(s) requested today     Note is dictated utilizing voice recognition software. Although note has been proof read prior to signing,  occasional typographical errors still can be missed. If any questions arise, please do not hesitate to call for verification.   electronically signed by:  Howard Pouch, DO  [place]

## 2021-03-01 LAB — PTH, INTACT AND CALCIUM: PTH: <6 pg/mL — ABNORMAL LOW (ref 16–77)

## 2021-03-30 ENCOUNTER — Ambulatory Visit (INDEPENDENT_AMBULATORY_CARE_PROVIDER_SITE_OTHER): Payer: Commercial Managed Care - PPO | Admitting: Nurse Practitioner

## 2021-03-30 ENCOUNTER — Other Ambulatory Visit: Payer: Self-pay

## 2021-03-30 ENCOUNTER — Encounter: Payer: Self-pay | Admitting: Nurse Practitioner

## 2021-03-30 VITALS — BP 114/62 | Ht 63.0 in | Wt 115.0 lb

## 2021-03-30 DIAGNOSIS — Z3044 Encounter for surveillance of vaginal ring hormonal contraceptive device: Secondary | ICD-10-CM

## 2021-03-30 DIAGNOSIS — M85852 Other specified disorders of bone density and structure, left thigh: Secondary | ICD-10-CM | POA: Diagnosis not present

## 2021-03-30 DIAGNOSIS — Z01419 Encounter for gynecological examination (general) (routine) without abnormal findings: Secondary | ICD-10-CM

## 2021-03-30 MED ORDER — ETONOGESTREL-ETHINYL ESTRADIOL 0.12-0.015 MG/24HR VA RING
VAGINAL_RING | VAGINAL | 4 refills | Status: DC
Start: 2021-03-30 — End: 2022-04-05

## 2021-03-30 NOTE — Progress Notes (Signed)
Nichole Mcmahon 01-20-1971 [id number]   History:  50 y.o. G1P1 presents for annual exam. Light monthly spotting on Nuvaring continuously. Normal pap and mammogram history. 2019 right hip fracture from fall. Osteopenia managed by PCP.   Gynecologic History Patient's last menstrual period was 03/07/2021. Period Cycle (Days): 28 Period Duration (Days): 1 Period Pattern: Regular Menstrual Flow: Light Dysmenorrhea: None Contraception/Family planning: NuvaRing vaginal inserts Sexually active: Yes  Health maintenance Last Pap: 02/27/2018. Results were: Normal, 5-year repeat Last mammogram: 06/26/2020. Results were: Normal Last colonoscopy: 03/20/2020. Results were: Normal, 10-year recall Last Dexa: 04/11/2018. Results were: Z-score - 1.4  Past medical history, past surgical history, family history and social history were all reviewed and documented in the EPIC chart. Married. Works Engineer, petroleum at [employer]. 99 yo son currently living back home, works remote in Engineer, technical sales. Maternal history of DM, HTN, CAD.   ROS:  A ROS was performed and pertinent positives and negatives are included.  Exam:  Vitals:   03/30/21 1150  BP: 114/62  Weight: 115 lb (52.2 kg)  Height: 5\' 3"  (1.6 m)    Body mass index is 20.37 kg/m.  General appearance:  Normal Thyroid:  Symmetrical, normal in size, without palpable masses or nodularity. Respiratory  Auscultation:  Clear without wheezing or rhonchi Cardiovascular  Auscultation:  Regular rate, without rubs, murmurs or gallops  Edema/varicosities:  Not grossly evident Abdominal  Soft,nontender, without masses, guarding or rebound.  Liver/spleen:  No organomegaly noted  Hernia:  None appreciated  Skin  Inspection:  Grossly normal   Breasts: Examined lying and sitting.   Right: Without masses, retractions, discharge or axillary adenopathy.   Left: Without masses, retractions, discharge or axillary adenopathy. Genitourinary   Inguinal/mons:  Normal  without inguinal adenopathy  External genitalia:  Normal appearing vulva with no masses, tenderness, or lesions  BUS/Urethra/Skene's glands:  Normal  Vagina:  Normal appearing with normal color and discharge, no lesions  Cervix:  Normal appearing without discharge or lesions  Uterus:  Normal in size, shape and contour.  Midline and mobile, nontender  Adnexa/parametria:     Rt: Normal in size, without masses or tenderness.   Lt: Normal in size, without masses or tenderness.  Anus and perineum: Normal  Digital rectal exam: Normal sphincter tone without palpated masses or tenderness  Patient informed chaperone available to be present for breast and pelvic exam. Patient has requested no chaperone to be present. Patient has been advised what will be completed during breast and pelvic exam.  Assessment/Plan:  50 y.o. G1P1 for annual exam.   Well female exam with routine gynecological exam - Education provided on SBEs, importance of preventative screenings, current guidelines, high calcium diet, regular exercise, and multivitamin daily. Labs with PCP.   Encounter for surveillance of vaginal ring hormonal contraceptive device - Plan: etonogestrel-ethinyl estradiol (NUVARING) 0.12-0.015 MG/24HR vaginal ring. Light monthly spotting. Taking continuously. Refill x 1 year provided.   Osteopenia, left femoral neck - 2019 right hip fracture from fall. Bone density afterwards showed osteopenia in that hip. Mother has osteoporosis. Taking daily vitamin D supplement and doing regular resistance training. Bone density managed by PCP. Recommend repeating at 5-year interval.  Screening for cervical cancer - Normal pap history. Will repeat at 5-year interval per guidelines.   Screening for breast cancer - Normal mammogram history. Continue annual screenings. Normal breast exam today.   Screening for colon cancer - 03/2020 colonoscopy. Will repeat at GI's recommended interval.   Follow up in 1 year for  annual.     Tamela Gammon [facility], 12:09 PM 03/30/2021

## 2021-05-03 ENCOUNTER — Encounter: Payer: Self-pay | Admitting: Family Medicine

## 2021-05-03 DIAGNOSIS — Z8249 Family history of ischemic heart disease and other diseases of the circulatory system: Secondary | ICD-10-CM

## 2021-05-03 NOTE — Addendum Note (Signed)
Addended by: Kavin Leech on: 05/03/2021 03:26 PM   Modules accepted: Orders

## 2021-05-03 NOTE — Telephone Encounter (Signed)
Okay to place referral

## 2021-05-03 NOTE — Telephone Encounter (Signed)
Please advise 

## 2021-06-12 NOTE — Progress Notes (Deleted)
Cardiology Office Note:    Date:  06/12/2021   ID:  [name], DOB 1970-12-17, MRN [id number]  PCP:  Ma Hillock, DO   [facility] Providers Cardiologist:  None {   Referring MD: Ma Hillock, DO    History of Present Illness:    Nichole Mcmahon is a 51 y.o. female with a hx of gestational DM, HLD, depression and family history of heart disease who was referred by Dr. Raoul Pitch for further evaluation of family history of CVD.  Today,   Past Medical History:  Diagnosis Date   Blood transfusion without reported diagnosis    Depression    Displaced subtrochanteric fracture of right femur, initial encounter for closed fracture ([place]) 12/20/2017   Glaucoma    Hx gestational diabetes    Hyperlipidemia    Osteopenia     Past Surgical History:  Procedure Laterality Date   COLONOSCOPY  03/20/2020   K Nandigam   FEMUR IM NAIL Right 12/21/2017   Procedure: INTRAMEDULLARY (IM) NAIL FEMORAL;  Surgeon: Shona Needles, MD;  Location: [place];  Service: Orthopedics;  Laterality: Right;   RHINOPLASTY  2007    Current Medications: No outpatient medications have been marked as taking for the 06/16/21 encounter (Appointment) with Freada Bergeron, MD.     Allergies:   Patient has no known allergies.   Social History   Socioeconomic History   Marital status: Married    Spouse name: Not on file   Number of children: Not on file   Years of education: Not on file   Highest education level: Not on file  Occupational History   Not on file  Tobacco Use   Smoking status: Never   Smokeless tobacco: Never  Vaping Use   Vaping Use: Never used  Substance and Sexual Activity   Alcohol use: No    Alcohol/week: 0.0 standard drinks   Drug use: No   Sexual activity: Yes    Birth control/protection: Inserts    Comment: MARRIED; DES NEG, DECLINED INSURANCE QUESTIONS  Other Topics Concern   Not on file  Social History Narrative   Married.   1 son.   Works for a [employer].   Enjoys swimming, going to the beach.    Social Determinants of Health   Financial Resource Strain: Not on file  Food Insecurity: Not on file  Transportation Needs: Not on file  Physical Activity: Not on file  Stress: Not on file  Social Connections: Not on file     Family History: The patient's ***family history includes Diabetes in her maternal grandmother and mother; Healthy in her son; Heart disease in her brother, maternal grandfather, and maternal grandmother; Hypertension in her maternal grandmother and mother; Other in her mother. There is no history of Colon cancer, Colon polyps, Esophageal cancer, Rectal cancer, or Stomach cancer.  ROS:   Please see the history of present illness.    *** All other systems reviewed and are negative.  EKGs/Labs/Other Studies Reviewed:    The following studies were reviewed today: ***  EKG:  EKG is *** ordered today.  The ekg ordered today demonstrates ***  Recent Labs: 11/06/2020: BUN 16; Creatinine, Ser 0.77; Hemoglobin 13.3; Platelets 337.0; Potassium 4.4; Sodium 138; TSH 2.61 02/26/2021: ALT 9  Recent Lipid Panel    Component Value Date/Time   CHOL 191 11/06/2020 0839   TRIG 162.0 (H) 11/06/2020 0839   HDL 50.90 11/06/2020 0839   CHOLHDL 4 11/06/2020 4268  VLDL 32.4 11/06/2020 0839   LDLCALC 107 (H) 11/06/2020 0839     Risk Assessment/Calculations:   {Does this patient have ATRIAL FIBRILLATION?:[phone]}       Physical Exam:    VS:  There were no vitals taken for this visit.    Wt Readings from Last 3 Encounters:  03/30/21 115 lb (52.2 kg)  02/26/21 115 lb (52.2 kg)  11/06/20 111 lb (50.3 kg)     GEN: *** Well nourished, well developed in no acute distress HEENT: Normal NECK: No JVD; No carotid bruits LYMPHATICS: No lymphadenopathy CARDIAC: ***RRR, no murmurs, rubs, gallops RESPIRATORY:  Clear to auscultation without rales, wheezing or rhonchi  ABDOMEN: Soft, non-tender,  non-distended MUSCULOSKELETAL:  No edema; No deformity  SKIN: Warm and dry NEUROLOGIC:  Alert and oriented x 3 PSYCHIATRIC:  Normal affect   ASSESSMENT:    No diagnosis found. PLAN:    In order of problems listed above:  #Strong Family History of Premature CAD: Patient's brother required CABG at 54.  -Check Ca score      {Are you ordering a CV Procedure (e.g. stress test, cath, DCCV, TEE, etc)?   Press F2        :[id number]}    Medication Adjustments/Labs and Tests Ordered: Current medicines are reviewed at length with the patient today.  Concerns regarding medicines are outlined above.  No orders of the defined types were placed in this encounter.  No orders of the defined types were placed in this encounter.   There are no Patient Instructions on file for this visit.   Signed, Freada Bergeron, MD  06/12/2021 9:01 PM    [place]

## 2021-06-16 ENCOUNTER — Ambulatory Visit: Payer: Commercial Managed Care - PPO | Admitting: Cardiology

## 2021-06-16 ENCOUNTER — Encounter: Payer: Self-pay | Admitting: Cardiology

## 2021-06-16 ENCOUNTER — Other Ambulatory Visit: Payer: Self-pay

## 2021-06-16 VITALS — BP 114/76 | HR 79 | Ht 63.0 in | Wt 114.4 lb

## 2021-06-16 DIAGNOSIS — Z8249 Family history of ischemic heart disease and other diseases of the circulatory system: Secondary | ICD-10-CM | POA: Diagnosis not present

## 2021-06-16 DIAGNOSIS — Z8632 Personal history of gestational diabetes: Secondary | ICD-10-CM | POA: Diagnosis not present

## 2021-06-16 NOTE — Patient Instructions (Signed)
Medication Instructions:   Your physician recommends that you continue on your current medications as directed. Please refer to the Current Medication list given to you today.  *If you need a refill on your cardiac medications before your next appointment, please call your pharmacy*   Testing/Procedures:  CARDIAC CALCIUM SCORE TO BE DONE HERE IN THE OFFICE--SELF PAY   Follow-Up: At [facility], you and your health needs are our priority.  As part of our continuing mission to provide you with exceptional heart care, we have created designated Provider Care Teams.  These Care Teams include your primary Cardiologist (physician) and Advanced Practice Providers (APPs -  Physician Assistants and Nurse Practitioners) who all work together to provide you with the care you need, when you need it.  We recommend signing up for the patient portal called "MyChart".  Sign up information is provided on this After Visit Summary.  MyChart is used to connect with patients for Virtual Visits (Telemedicine).  Patients are able to view lab/test results, encounter notes, upcoming appointments, etc.  Non-urgent messages can be sent to your provider as well.   To learn more about what you can do with MyChart, go to [email or web address].    Your next appointment:   1 year(s)  The format for your next appointment:   In Person  Provider:   DR. Johney Frame

## 2021-06-16 NOTE — Progress Notes (Signed)
Cardiology Office Note:    Date:  06/16/2021   ID:  Nichole Mcmahon, DOB 1970-12-11, MRN [id number]  PCP:  Ma Hillock, DO   [facility] Providers Cardiologist:  None {  Referring MD: Ma Hillock, DO    History of Present Illness:    Nichole Mcmahon is a 51 y.o. female with a hx of gestational DM, HLD, depression and family history of heart disease who was referred by Dr. Raoul Pitch for further evaluation of family history of CVD.  Today, the patient states she overall feels well. Her main concern today is evaluating her heart health proactively due to her extensive family history of heart disease. In her family, her mother has a cardiovascular history including hypertension, hyperlipidemia, kidney transplant (kidney failure secondary to hypertension), CABG x2, pacemaker implant, and diabetes. Her maternal uncle died at 64 yo of a massive heart attack. Her brother underwent CABG x5 at 40 yo. Fortunately, she overall feels well with no exertional symptoms. At home her blood pressure usually runs lower than today's reading of 114/76.  For exercise she has been working out regularly with more light weight lifting. In her diet she has been avoiding red meats for 10+ years. Current supplements include vitamin D. She is not a smoker.  Complications during her last pregnancy included pre-term labor and gestational diabetes. She denies being diabetic at this time.  She denies any palpitations, chest pain, or shortness of breath. No lightheadedness, headaches, syncope, orthopnea, PND, lower extremity edema or exertional symptoms.  Past Medical History:  Diagnosis Date   Blood transfusion without reported diagnosis    Depression    Displaced subtrochanteric fracture of right femur, initial encounter for closed fracture ([place]) 12/20/2017   Glaucoma    Hx gestational diabetes    Hyperlipidemia    Osteopenia     Past Surgical History:  Procedure Laterality Date   COLONOSCOPY  03/20/2020    [facility]   FEMUR IM NAIL Right 12/21/2017   Procedure: INTRAMEDULLARY (IM) NAIL FEMORAL;  Surgeon: Shona Needles, MD;  Location: [facility];  Service: Orthopedics;  Laterality: Right;   RHINOPLASTY  2007    Current Medications: Current Meds  Medication Sig   aspirin EC 81 MG tablet Take 81 mg by mouth daily. Swallow whole.   b complex vitamins capsule Take 1 capsule by mouth daily.   esomeprazole (NEXIUM) 40 MG capsule Take 40 mg by mouth daily before breakfast.     etodolac (LODINE) 400 MG tablet Take 400 mg by mouth 2 (two) times daily as needed.   etonogestrel-ethinyl estradiol (NUVARING) 0.12-0.015 MG/24HR vaginal ring Insert vaginally and leave in place for 3 consecutive weeks, then remove for 1 week.   fluticasone (FLONASE) 50 MCG/ACT nasal spray Place into both nostrils daily.   Glucosamine-Chondroit-Vit C-Mn (GLUCOSAMINE 1500 COMPLEX PO) Take by mouth.   loratadine (CLARITIN) 10 MG tablet Take 10 mg by mouth daily.   multivitamin (THERAGRAN) per tablet Take 1 tablet by mouth daily.   Omega-3 Fatty Acids (FISH OIL OMEGA-3 PO) Take 1 capsule by mouth daily.    SODIUM FLUORIDE 5000 PPM 1.1 % PSTE See admin instructions.   tretinoin (RETIN-A) 0.025 % cream Apply topically at bedtime. qhs to face   TURMERIC PO Take by mouth.     Allergies:   Patient has no known allergies.   Social History   Socioeconomic History   Marital status: Married    Spouse name: Not on file   Number of  children: Not on file   Years of education: Not on file   Highest education level: Not on file  Occupational History   Not on file  Tobacco Use   Smoking status: Never   Smokeless tobacco: Never  Vaping Use   Vaping Use: Never used  Substance and Sexual Activity   Alcohol use: No    Alcohol/week: 0.0 standard drinks   Drug use: No   Sexual activity: Yes    Birth control/protection: Inserts    Comment: MARRIED; DES NEG, DECLINED INSURANCE QUESTIONS  Other Topics Concern   Not on file  Social  History Narrative   Married.   1 son.   Works for a [employer].   Enjoys swimming, going to the beach.    Social Determinants of Health   Financial Resource Strain: Not on file  Food Insecurity: Not on file  Transportation Needs: Not on file  Physical Activity: Not on file  Stress: Not on file  Social Connections: Not on file     Family History: The patient's family history includes Diabetes in her maternal grandmother and mother; Healthy in her son; Heart disease in her brother, maternal grandfather, and maternal grandmother; Hypertension in her maternal grandmother and mother; Other in her mother. There is no history of Colon cancer, Colon polyps, Esophageal cancer, Rectal cancer, or Stomach cancer.  ROS:   Review of Systems  Constitutional:  Negative for chills and fever.  HENT:  Negative for nosebleeds and tinnitus.   Eyes:  Negative for blurred vision and double vision.  Respiratory:  Negative for cough, hemoptysis and shortness of breath.   Cardiovascular:  Negative for chest pain, palpitations, orthopnea, claudication, leg swelling and PND.  Gastrointestinal:  Negative for nausea and vomiting.  Genitourinary:  Negative for dysuria and urgency.  Musculoskeletal:  Negative for myalgias and neck pain.  Neurological:  Negative for dizziness and headaches.  Endo/Heme/Allergies:  Does not bruise/bleed easily.  Psychiatric/Behavioral:  Negative for depression and suicidal ideas.     EKGs/Labs/Other Studies Reviewed:    The following studies were reviewed today: No prior cardiovascular studies available.   EKG:  EKG is personally reviewed.   06/16/2021: Sinus rhythm. Rate 79 bpm.   Recent Labs: 11/06/2020: BUN 16; Creatinine, Ser 0.77; Hemoglobin 13.3; Platelets 337.0; Potassium 4.4; Sodium 138; TSH 2.61 02/26/2021: ALT 9   Recent Lipid Panel    Component Value Date/Time   CHOL 191 11/06/2020 0839   TRIG 162.0 (H) 11/06/2020 0839   HDL 50.90 11/06/2020 0839    CHOLHDL 4 11/06/2020 0839   VLDL 32.4 11/06/2020 0839   LDLCALC 107 (H) 11/06/2020 0839     Risk Assessment/Calculations:           Physical Exam:    VS:  BP 114/76    Pulse 79    Ht 5\' 3"  (1.6 m)    Wt 114 lb 6.4 oz (51.9 kg)    SpO2 99%    BMI 20.27 kg/m     Wt Readings from Last 3 Encounters:  06/16/21 114 lb 6.4 oz (51.9 kg)  03/30/21 115 lb (52.2 kg)  02/26/21 115 lb (52.2 kg)     GEN: Well nourished, well developed in no acute distress HEENT: Normal NECK: No JVD; No carotid bruits CARDIAC: RRR, 1/6 soft flow murmur, rubs, gallops RESPIRATORY:  Clear to auscultation without rales, wheezing or rhonchi  ABDOMEN: Soft, non-tender, non-distended MUSCULOSKELETAL:  No edema; No deformity  SKIN: Warm and dry NEUROLOGIC:  Alert and oriented  x 3 PSYCHIATRIC:  Normal affect   ASSESSMENT:    1. Family history of early CAD   2. History of gestational diabetes    PLAN:    In order of problems listed above:  #Strong Family History of Premature CAD: Has very strong family history of CAD including her mother who had hypertension, hyperlipidemia, kidney transplant (kidney failure secondary to hypertension), CABG x2, pacemaker implant, and diabetes. Her maternal uncle died at 79 yo of a massive heart attack. Her brother underwent CABG x5 at 73 yo. She is fortunately asymptomatic and active. LDL 107.  -Check Ca score  #History of Gestational DM: Higher risk for CV complications. Currently not diabetic and BP well controlled. Will continue with regular CV follow-up for risk factor modification.  -Check Ca score as above -Diet and lifestyle modifications as detailed below  Exercise recommendations: Goal of exercising for at least 30 minutes a day, at least 5 times per week.  Please exercise to a moderate exertion.  This means that while exercising it is difficult to speak in full sentences, however you are not so short of breath that you feel you must stop, and not so comfortable  that you can carry on a full conversation.  Exertion level should be approximately a 5/10, if 10 is the most exertion you can perform.  Diet recommendations: Recommend a heart healthy diet such as the Mediterranean diet.  This diet consists of plant based foods, healthy fats, lean meats, olive oil.  It suggests limiting the intake of simple carbohydrates such as white breads, pastries, and pastas.  It also limits the amount of red meat, wine, and dairy products such as cheese that one should consume on a daily basis.     Follow-up:  1 year  Medication Adjustments/Labs and Tests Ordered: Current medicines are reviewed at length with the patient today.  Concerns regarding medicines are outlined above.   Orders Placed This Encounter  Procedures   CT CARDIAC SCORING (SELF PAY ONLY)   EKG 12-Lead   No orders of the defined types were placed in this encounter.  Patient Instructions  Medication Instructions:   Your physician recommends that you continue on your current medications as directed. Please refer to the Current Medication list given to you today.  *If you need a refill on your cardiac medications before your next appointment, please call your pharmacy*   Testing/Procedures:  CARDIAC CALCIUM SCORE TO BE DONE HERE IN THE OFFICE--SELF PAY   Follow-Up: At [facility], you and your health needs are our priority.  As part of our continuing mission to provide you with exceptional heart care, we have created designated Provider Care Teams.  These Care Teams include your primary Cardiologist (physician) and Advanced Practice Providers (APPs -  Physician Assistants and Nurse Practitioners) who all work together to provide you with the care you need, when you need it.  We recommend signing up for the patient portal called "MyChart".  Sign up information is provided on this After Visit Summary.  MyChart is used to connect with patients for Virtual Visits (Telemedicine).  Patients are able  to view lab/test results, encounter notes, upcoming appointments, etc.  Non-urgent messages can be sent to your provider as well.   To learn more about what you can do with MyChart, go to [email or web address].    Your next appointment:   1 year(s)  The format for your next appointment:   In Person  Provider:   DR. Delmar Landau  Stumpf,acting as a Education administrator for Freada Bergeron, MD.,have documented all relevant documentation on the behalf of Freada Bergeron, MD,as directed by  Freada Bergeron, MD while in the presence of Freada Bergeron, MD.  I, Freada Bergeron, MD, have reviewed all documentation for this visit. The documentation on 06/16/21 for the exam, diagnosis, procedures, and orders are all accurate and complete.   Signed, Freada Bergeron, MD  06/16/2021 8:43 AM    [place]

## 2021-07-06 ENCOUNTER — Ambulatory Visit (INDEPENDENT_AMBULATORY_CARE_PROVIDER_SITE_OTHER): Payer: Commercial Managed Care - PPO | Admitting: Dermatology

## 2021-07-06 ENCOUNTER — Other Ambulatory Visit: Payer: Self-pay

## 2021-07-06 DIAGNOSIS — L988 Other specified disorders of the skin and subcutaneous tissue: Secondary | ICD-10-CM

## 2021-07-06 DIAGNOSIS — L578 Other skin changes due to chronic exposure to nonionizing radiation: Secondary | ICD-10-CM

## 2021-07-06 DIAGNOSIS — D1800 Hemangioma unspecified site: Secondary | ICD-10-CM

## 2021-07-06 DIAGNOSIS — S80861A Insect bite (nonvenomous), right lower leg, initial encounter: Secondary | ICD-10-CM

## 2021-07-06 DIAGNOSIS — L814 Other melanin hyperpigmentation: Secondary | ICD-10-CM

## 2021-07-06 DIAGNOSIS — L818 Other specified disorders of pigmentation: Secondary | ICD-10-CM | POA: Diagnosis not present

## 2021-07-06 DIAGNOSIS — W57XXXA Bitten or stung by nonvenomous insect and other nonvenomous arthropods, initial encounter: Secondary | ICD-10-CM

## 2021-07-06 DIAGNOSIS — S70361A Insect bite (nonvenomous), right thigh, initial encounter: Secondary | ICD-10-CM

## 2021-07-06 DIAGNOSIS — I821 Thrombophlebitis migrans: Secondary | ICD-10-CM

## 2021-07-06 DIAGNOSIS — I8393 Asymptomatic varicose veins of bilateral lower extremities: Secondary | ICD-10-CM

## 2021-07-06 DIAGNOSIS — L609 Nail disorder, unspecified: Secondary | ICD-10-CM | POA: Diagnosis not present

## 2021-07-06 DIAGNOSIS — D229 Melanocytic nevi, unspecified: Secondary | ICD-10-CM

## 2021-07-06 DIAGNOSIS — D2271 Melanocytic nevi of right lower limb, including hip: Secondary | ICD-10-CM

## 2021-07-06 DIAGNOSIS — D225 Melanocytic nevi of trunk: Secondary | ICD-10-CM

## 2021-07-06 DIAGNOSIS — Z1283 Encounter for screening for malignant neoplasm of skin: Secondary | ICD-10-CM

## 2021-07-06 MED ORDER — TRETINOIN 0.025 % EX CREA: TOPICAL_CREAM | Freq: Every evening | CUTANEOUS | 6 refills | Status: DC

## 2021-07-06 NOTE — Progress Notes (Signed)
Follow-Up Visit   Subjective  Nichole Mcmahon is a 51 y.o. female who presents for the following: Annual Exam (Facial elastosis patient currently using Tretinoin 0.025% cream QHS as the 0.05% cream dried her skin out. She would like refills today. ). The patient presents for Total-Body Skin Exam (TBSE) for skin cancer screening and mole check.  The patient has spots, moles and lesions to be evaluated, some may be new or changing.   The following portions of the chart were reviewed this encounter and updated as appropriate:       Review of Systems:  No other skin or systemic complaints except as noted in HPI or Assessment and Plan.  Objective  Well appearing patient in no apparent distress; mood and affect are within normal limits.  A full examination was performed including scalp, head, eyes, ears, nose, lips, neck, chest, axillae, abdomen, back, buttocks, bilateral upper extremities, bilateral lower extremities, hands, feet, fingers, toes, fingernails, and toenails. All findings within normal limits unless otherwise noted below.  Face Rhytides and volume loss.   Finger and toenails Nail dystrophy with mild distal onycholysis and vertical splitting.   B/L arms Scattered small white macules.   R ant thigh 0.3 cm medium brown macule.   L buttock 0.2 cm medium dark brown macule.   R ant thigh Erythematous edematous papule.     Assessment & Plan  Elastosis of skin Face  Continue Tretinoin 0.025% cream QHS.   Topical retinoid medications like tretinoin/Retin-A, adapalene/Differin, tazarotene/Fabior, and Epiduo/Epiduo Forte can cause dryness and irritation when first started. Only apply a pea-sized amount to the entire affected area. Avoid applying it around the eyes, edges of mouth and creases at the nose. If you experience irritation, use a good moisturizer first and/or apply the medicine less often. If you are doing well with the medicine, you can increase how often you  use it until you are applying every night. Be careful with sun protection while using this medication as it can make you sensitive to the sun. This medicine should not be used by pregnant women.   Discussed filler to the temple and mid face area. Recommend patient consulting with Dr. Laurence Ferrari for injection with canula due to location. Advised patient of cost between $650 and $700 per syringe.    tretinoin (RETIN-A) 0.025 % cream - Face Apply topically at bedtime. qhs to face  Nail problem Finger and toenails  Mild dystrophic/brittle nails, No hx of trauma that patient can recall. Splitting occurs every few months.   Start Derma-nail solution OTC bid to proximal nail fold and Cutemol cream after hand washing [email or web address]    Idiopathic guttate hypomelanosis B/L arms  Benign-appearing.  Observation.  Call clinic for new or changing lesions.  Recommend daily use of broad spectrum spf 30+ sunscreen to sun-exposed areas.    Nevus (2) L buttock; R ant thigh  Benign-appearing.  Observation.  Call clinic for new or changing moles.  Recommend daily use of broad spectrum spf 30+ sunscreen to sun-exposed areas.   Insect bite of right lower leg with local reaction, initial encounter R ant thigh  Benign, observe. RTC if not resolving.     Lentigines - Scattered tan macules - Due to sun exposure - Benign-appearing, observe - Recommend daily broad spectrum sunscreen SPF 30+ to sun-exposed areas, reapply every 2 hours as needed. - Call for any changes  Seborrheic Keratoses - Stuck-on, waxy, tan-brown papules and/or plaques  - Benign-appearing - Discussed benign etiology and prognosis. -  Observe - Call for any changes  Melanocytic Nevi - Tan-brown and/or pink-flesh-colored symmetric macules and papules - Benign appearing on exam today - Observation - Call clinic for new or changing moles - Recommend daily use of broad  spectrum spf 30+ sunscreen to sun-exposed areas.   Hemangiomas - Red papules - Discussed benign nature - Observe - Call for any changes  Actinic Damage - Chronic condition, secondary to cumulative UV/sun exposure - diffuse scaly erythematous macules with underlying dyspigmentation - Recommend daily broad spectrum sunscreen SPF 30+ to sun-exposed areas, reapply every 2 hours as needed.  - Staying in the shade or wearing long sleeves, sun glasses (UVA+UVB protection) and wide brim hats (4-inch brim around the entire circumference of the hat) are also recommended for sun protection.  - Call for new or changing lesions.  Varicose Veins/Spider Veins - Dilated blue, purple or red veins at the lower extremities - Reassured - Smaller vessels can be treated by sclerotherapy (a procedure to inject a medicine into the veins to make them disappear) if desired, but the treatment is not covered by insurance. Larger vessels may be covered if symptomatic and we would refer to vascular surgeon if treatment desired.  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  Return in about 1 year (around 07/06/2022) for TBSE.  Luther Redo, CMA, am acting as scribe for Brendolyn Patty, MD .  Documentation: I have reviewed the above documentation for accuracy and completeness, and I agree with the above.  Brendolyn Patty MD

## 2021-07-06 NOTE — Patient Instructions (Addendum)
Recommend over the counter DermaNail [email or web address]    Continue Tretinoin 0.025% cream apply a pea sized amount to the entire face every night. Topical retinoid medications like tretinoin/Retin-A, adapalene/Differin, tazarotene/Fabior, and Epiduo/Epiduo Forte can cause dryness and irritation when first started. Only apply a pea-sized amount to the entire affected area. Avoid applying it around the eyes, edges of mouth and creases at the nose. If you experience irritation, use a good moisturizer first and/or apply the medicine less often. If you are doing well with the medicine, you can increase how often you use it until you are applying every night. Be careful with sun protection while using this medication as it can make you sensitive to the sun. This medicine should not be used by pregnant women.     Seborrheic Keratosis  What causes seborrheic keratoses? Seborrheic keratoses are harmless, common skin growths that first appear during adult life.  As time goes by, more growths appear.  Some people may develop a large number of them.  Seborrheic keratoses appear on both covered and uncovered body parts.  They are not caused by sunlight.  The tendency to develop seborrheic keratoses can be inherited.  They vary in color from skin-colored to gray, brown, or even black.  They can be either smooth or have a rough, warty surface.   Seborrheic keratoses are superficial and look as if they were stuck on the skin.  Under the microscope this type of keratosis looks like layers upon layers of skin.  That is why at times the top layer may seem to fall off, but the rest of the growth remains and re-grows.    Treatment Seborrheic keratoses do not need to be treated, but can easily be removed in the office.  Seborrheic keratoses often cause symptoms when they rub on clothing or jewelry.  Lesions can be in the way of shaving.  If they become inflamed,  they can cause itching, soreness, or burning.  Removal of a seborrheic keratosis can be accomplished by freezing, burning, or surgery. If any spot bleeds, scabs, or grows rapidly, please return to have it checked, as these can be an indication of a skin cancer.     If You Need Anything After Your Visit  If you have any questions or concerns for your doctor, please call our main line at [phone] and press option 4 to reach your doctor's medical assistant. If no one answers, please leave a voicemail as directed and we will return your call as soon as possible. Messages left after 4 pm will be answered the following business day.   You may also send [place] a message via [place]. We typically respond to MyChart messages within 1-2 business days.  For prescription refills, please ask your pharmacy to contact our office. Our fax number is [phone].  If you have an urgent issue when the clinic is closed that cannot wait until the next business day, you can page your doctor at the number below.    Please note that while we do our best to be available for urgent issues outside of office hours, we are not available 24/7.   If you have an urgent issue and are unable to reach [place], you may choose to seek medical care at your doctor's office, retail clinic, urgent care center, or emergency room.  If you have a medical emergency, please immediately call 911 or go to the emergency department.  Pager Numbers  - Dr. Nehemiah Massed: [phone]  - Dr. Laurence Ferrari: [phone]  -  Dr. Nicole Kindred: [phone]  In the event of inclement weather, please call our main line at [phone] for an update on the status of any delays or closures.  Dermatology Medication Tips: Please keep the boxes that topical medications come in in order to help keep track of the instructions about where and how to use these. Pharmacies typically print the medication instructions only on the boxes and not directly on the medication  tubes.   If your medication is too expensive, please contact our office at [phone] option 4 or send [place] a message through [place].   We are unable to tell what your co-pay for medications will be in advance as this is different depending on your insurance coverage. However, we may be able to find a substitute medication at lower cost or fill out paperwork to get insurance to cover a needed medication.   If a prior authorization is required to get your medication covered by your insurance company, please allow [place] 1-2 business days to complete this process.  Drug prices often vary depending on where the prescription is filled and some pharmacies may offer cheaper prices.  The website www.goodrx.com contains coupons for medications through different pharmacies. The prices here do not account for what the cost may be with help from insurance (it may be cheaper with your insurance), but the website can give you the price if you did not use any insurance.  - You can print the associated coupon and take it with your prescription to the pharmacy.  - You may also stop by our office during regular business hours and pick up a GoodRx coupon card.  - If you need your prescription sent electronically to a different pharmacy, notify our office through [facility] or by phone at [phone] option 4.     Si Usted Necesita Algo Despus de Su Visita  Tambin puede enviarnos un mensaje a travs de Pharmacist, community. Por lo general respondemos a los mensajes de MyChart en el transcurso de 1 a 2 das hbiles.  Para renovar recetas, por favor pida a su farmacia que se ponga en contacto con nuestra oficina. Harland Dingwall de fax es [place] [phone].  Si tiene un asunto urgente cuando la clnica est cerrada y que no puede esperar hasta el siguiente da hbil, puede llamar/localizar a su doctor(a) al nmero que aparece a continuacin.   Por favor, tenga en cuenta que aunque hacemos todo lo posible para estar  disponibles para asuntos urgentes fuera del horario de [place], no estamos disponibles las 24 horas del da, los 7 das de la [place].   Si tiene un problema urgente y no puede comunicarse con nosotros, puede optar por buscar atencin mdica  en el consultorio de su doctor(a), en una clnica privada, en un centro de atencin urgente o en una sala de emergencias.  Si tiene Engineering geologist, por favor llame inmediatamente al 911 o vaya a la sala de emergencias.  Nmeros de bper  - Dr. Nehemiah Massed: [phone]  - Dra. Moye: [phone]  - Dra. Nicole Kindred: [phone]  En caso de inclemencias del [place], por favor llame a Johnsie Kindred principal al [phone] para una actualizacin sobre el [place] de cualquier retraso o cierre.  Consejos para la medicacin en dermatologa: Por favor, guarde las cajas en las que vienen los medicamentos de uso tpico para ayudarle a seguir las instrucciones sobre dnde y cmo usarlos. Las farmacias generalmente imprimen las instrucciones del medicamento slo en las cajas y no directamente en los tubos  del medicamento.   Si su medicamento es muy caro, por favor, pngase en contacto con Zigmund Daniel llamando al [phone] y presione la opcin 4 o envenos un mensaje a travs de Pharmacist, community.   No podemos decirle cul ser su copago por los medicamentos por adelantado ya que esto es diferente dependiendo de la cobertura de su seguro. Sin embargo, es posible que podamos encontrar un medicamento sustituto a Electrical engineer un formulario para que el seguro cubra el medicamento que se considera necesario.   Si se requiere una autorizacin previa para que su compaa de seguros [place] su medicamento, por favor permtanos de 1 a 2 das hbiles para completar este proceso.  Los precios de los medicamentos varan con frecuencia dependiendo del Environmental consultant de dnde se surte la receta y alguna farmacias pueden ofrecer precios ms baratos.  El sitio web www.goodrx.com tiene  cupones para medicamentos de Airline pilot. Los precios aqu no tienen en cuenta lo que podra costar con la ayuda del seguro (puede ser ms barato con su seguro), pero el sitio web puede darle el precio si no utiliz Research scientist (physical sciences).  - Puede imprimir el cupn correspondiente y llevarlo con su receta a la farmacia.  - Tambin puede pasar por nuestra oficina durante el horario de atencin regular y Charity fundraiser una tarjeta de cupones de GoodRx.  - Si necesita que su receta se enve electrnicamente a una farmacia diferente, informe a nuestra oficina a travs de MyChart de [place] o por telfono llamando al [phone] y presione la opcin 4.

## 2021-07-12 ENCOUNTER — Other Ambulatory Visit: Payer: Self-pay | Admitting: Nurse Practitioner

## 2021-07-12 DIAGNOSIS — Z1231 Encounter for screening mammogram for malignant neoplasm of breast: Secondary | ICD-10-CM

## 2021-07-27 ENCOUNTER — Ambulatory Visit
Admission: RE | Admit: 2021-07-27 | Discharge: 2021-07-27 | Disposition: A | Discharge status: Home / Self Care | Payer: Commercial Managed Care - PPO | Source: Ambulatory Visit | Attending: Nurse Practitioner | Admitting: Nurse Practitioner

## 2021-07-27 ENCOUNTER — Other Ambulatory Visit: Payer: Self-pay

## 2021-07-27 DIAGNOSIS — Z1231 Encounter for screening mammogram for malignant neoplasm of breast: Secondary | ICD-10-CM

## 2021-07-30 ENCOUNTER — Other Ambulatory Visit: Payer: Self-pay

## 2021-07-30 ENCOUNTER — Ambulatory Visit (INDEPENDENT_AMBULATORY_CARE_PROVIDER_SITE_OTHER)
Admission: RE | Admit: 2021-07-30 | Discharge: 2021-07-30 | Disposition: A | Discharge status: Home / Self Care | Payer: Self-pay | Source: Ambulatory Visit | Attending: Cardiology | Admitting: Cardiology

## 2021-07-30 DIAGNOSIS — Z8249 Family history of ischemic heart disease and other diseases of the circulatory system: Secondary | ICD-10-CM

## 2021-08-02 ENCOUNTER — Encounter: Payer: Self-pay | Admitting: Cardiology

## 2021-08-02 NOTE — Telephone Encounter (Signed)
Spoke with pt and advised per Dr Johney Frame: ? ?Her calcium score is 0.7. This is a very low number but because she is young and her Ca score is above 0, she is in the 83% for age, gender matched controls. We can either just monitor and continue lifestyle modifications or start low dose crestor '5mg'$  daily to drive her VTX<52 to lower her cardiovascular risk given her significant family history. ? ?Pt verbalizes understanding and would like to know Dr Jacolyn Reedy thoughts re: Red Yeast Rice as a supplement for heart/cholesterol health.  She would prefer not to start medication but continue management through health eating and exercise and re-evaluate next year. ?

## 2021-10-22 ENCOUNTER — Encounter: Payer: Self-pay | Admitting: Family Medicine

## 2021-10-22 DIAGNOSIS — Z8249 Family history of ischemic heart disease and other diseases of the circulatory system: Secondary | ICD-10-CM

## 2021-10-22 DIAGNOSIS — M8580 Other specified disorders of bone density and structure, unspecified site: Secondary | ICD-10-CM

## 2021-10-22 DIAGNOSIS — E782 Mixed hyperlipidemia: Secondary | ICD-10-CM

## 2021-10-22 NOTE — Telephone Encounter (Signed)
Orders need to be placed during the office visit.  Options are we can either perform labs nonfasting during her visit.  Meaning she can eat and the only thing that changes in her labs is the triglyceride levels.  The rest of the labs are unaffected by fasting status. Or If she prefers fasting labs we can place the orders during her preventative visit for future so that she can come in at 8:00 to have labs collected after the visit.

## 2021-10-22 NOTE — Telephone Encounter (Signed)
Rerouting message to PCP.

## 2021-11-05 ENCOUNTER — Encounter: Payer: Commercial Managed Care - PPO | Admitting: Family Medicine

## 2021-11-09 ENCOUNTER — Encounter: Payer: Self-pay | Admitting: Family Medicine

## 2021-11-09 ENCOUNTER — Ambulatory Visit (INDEPENDENT_AMBULATORY_CARE_PROVIDER_SITE_OTHER): Payer: Commercial Managed Care - PPO | Admitting: Family Medicine

## 2021-11-09 VITALS — BP 114/69 | HR 73 | Temp 98.0°F | Ht 62.5 in | Wt 113.0 lb

## 2021-11-09 DIAGNOSIS — Z8249 Family history of ischemic heart disease and other diseases of the circulatory system: Secondary | ICD-10-CM | POA: Diagnosis not present

## 2021-11-09 DIAGNOSIS — E782 Mixed hyperlipidemia: Secondary | ICD-10-CM

## 2021-11-09 DIAGNOSIS — M8580 Other specified disorders of bone density and structure, unspecified site: Secondary | ICD-10-CM

## 2021-11-09 DIAGNOSIS — E673 Hypervitaminosis D: Secondary | ICD-10-CM | POA: Diagnosis not present

## 2021-11-09 DIAGNOSIS — Z0000 Encounter for general adult medical examination without abnormal findings: Secondary | ICD-10-CM

## 2021-11-09 MED ORDER — ATORVASTATIN CALCIUM 10 MG PO TABS: ORAL_TABLET | ORAL | 3 refills | Status: DC

## 2021-11-10 LAB — COMPREHENSIVE METABOLIC PANEL
AG Ratio: 1.5 (calc) (ref 1.0–2.5)
ALT: 8 U/L (ref 6–29)
AST: 10 U/L (ref 10–35)
Albumin: 4.2 g/dL (ref 3.6–5.1)
Alkaline phosphatase (APISO): 35 U/L — ABNORMAL LOW (ref 37–153)
BUN: 20 mg/dL (ref 7–25)
CO2: 21 mmol/L (ref 20–32)
Calcium: 9.1 mg/dL (ref 8.6–10.4)
Chloride: 105 mmol/L (ref 98–110)
Creat: 0.6 mg/dL (ref 0.50–1.03)
Globulin: 2.8 g/dL (calc) (ref 1.9–3.7)
Glucose, Bld: 91 mg/dL (ref 65–99)
Potassium: 4.1 mmol/L (ref 3.5–5.3)
Sodium: 139 mmol/L (ref 135–146)
Total Bilirubin: 0.3 mg/dL (ref 0.2–1.2)
Total Protein: 7 g/dL (ref 6.1–8.1)

## 2021-11-10 LAB — LIPID PANEL
Cholesterol: 169 mg/dL (ref <200)
HDL: 51 mg/dL (ref >=50)
LDL Cholesterol (Calc): 93 mg/dL (calc)
Non-HDL Cholesterol (Calc): 118 mg/dL (calc) (ref <130)
Total CHOL/HDL Ratio: 3.3 (calc) (ref <5.0)
Triglycerides: 148 mg/dL (ref <150)

## 2021-11-10 LAB — HEMOGLOBIN A1C

## 2021-11-10 LAB — VITAMIN D 25 HYDROXY (VIT D DEFICIENCY, FRACTURES): Vit D, 25-Hydroxy: 80 ng/mL (ref 30–100)

## 2021-11-10 LAB — CBC

## 2021-11-10 LAB — TSH: TSH: 4.07 mIU/L

## 2021-11-12 ENCOUNTER — Ambulatory Visit (INDEPENDENT_AMBULATORY_CARE_PROVIDER_SITE_OTHER): Payer: Commercial Managed Care - PPO

## 2021-11-12 ENCOUNTER — Encounter: Payer: Commercial Managed Care - PPO | Admitting: Family Medicine

## 2021-11-12 DIAGNOSIS — Z0000 Encounter for general adult medical examination without abnormal findings: Secondary | ICD-10-CM

## 2021-11-12 DIAGNOSIS — Z131 Encounter for screening for diabetes mellitus: Secondary | ICD-10-CM

## 2021-11-13 LAB — CBC
HCT: 38.3 % (ref 35.0–45.0)
Hemoglobin: 13 g/dL (ref 11.7–15.5)
MCH: 31 pg (ref 27.0–33.0)
MCHC: 33.9 g/dL (ref 32.0–36.0)
MCV: 91.2 fL (ref 80.0–100.0)
MPV: 10.4 fL (ref 7.5–12.5)
Platelets: 359 10*3/uL (ref 140–400)
RBC: 4.2 10*6/uL (ref 3.80–5.10)
RDW: 12 % (ref 11.0–15.0)
WBC: 10.2 10*3/uL (ref 3.8–10.8)

## 2021-11-13 LAB — HEMOGLOBIN A1C
Hgb A1c MFr Bld: 5 % of total Hgb (ref <5.7)
Mean Plasma Glucose: 97 mg/dL
eAG (mmol/L): 5.4 mmol/L

## 2021-11-18 ENCOUNTER — Encounter: Payer: Commercial Managed Care - PPO | Admitting: Family Medicine

## 2021-12-21 ENCOUNTER — Encounter: Payer: Self-pay | Admitting: Family Medicine

## 2022-01-31 ENCOUNTER — Encounter: Payer: Self-pay | Admitting: Family Medicine

## 2022-02-04 ENCOUNTER — Encounter: Payer: Commercial Managed Care - PPO | Admitting: Family Medicine

## 2022-02-04 LAB — LIPID PANEL
Cholesterol: 164 mg/dL (ref 0–200)
HDL: 49.2 mg/dL (ref >39.00)
LDL Cholesterol: 89 mg/dL (ref 0–99)
NonHDL: 114.51
Total CHOL/HDL Ratio: 3
Triglycerides: 129 mg/dL (ref 0.0–149.0)
VLDL: 25.8 mg/dL (ref 0.0–40.0)

## 2022-02-04 LAB — COMPREHENSIVE METABOLIC PANEL
ALT: 8 U/L (ref 0–35)
AST: 10 U/L (ref 0–37)
Albumin: 3.9 g/dL (ref 3.5–5.2)
Alkaline Phosphatase: 33 U/L — ABNORMAL LOW (ref 39–117)
BUN: 14 mg/dL (ref 6–23)
CO2: 27 mEq/L (ref 19–32)
Calcium: 9.3 mg/dL (ref 8.4–10.5)
Chloride: 106 mEq/L (ref 96–112)
Creatinine, Ser: 0.81 mg/dL (ref 0.40–1.20)
GFR: 83.7 mL/min (ref >60.00)
Glucose, Bld: 85 mg/dL (ref 70–99)
Potassium: 4.3 mEq/L (ref 3.5–5.1)
Sodium: 140 mEq/L (ref 135–145)
Total Bilirubin: 0.4 mg/dL (ref 0.2–1.2)
Total Protein: 6.8 g/dL (ref 6.0–8.3)

## 2022-02-07 NOTE — Progress Notes (Signed)
Appointment canceled-provider sick

## 2022-02-09 ENCOUNTER — Ambulatory Visit: Payer: Commercial Managed Care - PPO | Admitting: Family Medicine

## 2022-02-09 ENCOUNTER — Encounter: Payer: Self-pay | Admitting: Family Medicine

## 2022-02-09 VITALS — BP 97/63 | HR 84 | Temp 98.5°F | Ht 62.5 in | Wt 114.0 lb

## 2022-02-09 DIAGNOSIS — Z8249 Family history of ischemic heart disease and other diseases of the circulatory system: Secondary | ICD-10-CM

## 2022-02-09 DIAGNOSIS — E782 Mixed hyperlipidemia: Secondary | ICD-10-CM

## 2022-02-09 NOTE — Progress Notes (Signed)
Nichole Mcmahon , 07/26/1970, 51 y.o., female MRN: [id number] Patient Care Team    Relationship Specialty Notifications Start End  Ma Hillock, DO PCP - General Family Medicine  11/06/20   Tamela Gammon, NP Nurse Practitioner Gynecology  11/06/20   Mauri Pole, MD Consulting Physician Gastroenterology  11/06/20   Freada Bergeron, MD Consulting Physician Cardiology  11/09/21     Chief Complaint  Patient presents with   Hyperlipidemia    F/u;      Subjective: Nichole Mcmahon I a 51 y.o. pt returns today to discuss lipids after starting statin for CV protection after higher than average coronary calcium score.   Elevated triglycerides with high cholesterol/ FH: heart disease Tolerating Lipitor 5 mg rwice a week. CMp and lipids collected prior to appt    02/26/2021    9:37 AM 11/06/2020    8:12 AM  Depression screen PHQ 2/9  Decreased Interest 0 0  Down, Depressed, Hopeless 0 0  PHQ - 2 Score 0 0    No Known Allergies Social History   Social History Narrative   Married.   1 son.   Works for a [employer].   Enjoys swimming, going to the beach.    Past Medical History:  Diagnosis Date   Blood transfusion without reported diagnosis    Depression    Displaced subtrochanteric fracture of right femur, initial encounter for closed fracture ([place]) 12/20/2017   Glaucoma    Hx gestational diabetes    Hyperlipidemia    Osteopenia    Past Surgical History:  Procedure Laterality Date   COLONOSCOPY  03/20/2020   [facility]   FEMUR IM NAIL Right 12/21/2017   Procedure: INTRAMEDULLARY (IM) NAIL FEMORAL;  Surgeon: Shona Needles, MD;  Location: [facility];  Service: Orthopedics;  Laterality: Right;   RHINOPLASTY  2007   Family History  Problem Relation Age of Onset   Diabetes Mother    Hypertension Mother    Other Mother        kidney transplant due to HTN   Heart disease Brother    Healthy Son    Diabetes Maternal Grandmother    Hypertension  Maternal Grandmother    Heart disease Maternal Grandmother    Heart disease Maternal Grandfather    Colon cancer Neg Hx    Colon polyps Neg Hx    Esophageal cancer Neg Hx    Rectal cancer Neg Hx    Stomach cancer Neg Hx    Allergies as of 02/09/2022   No Known Allergies      Medication List        Accurate as of February 09, 2022  1:45 PM. If you have any questions, ask your nurse or doctor.          aspirin EC 81 MG tablet Take 81 mg by mouth daily. Swallow whole.   atorvastatin 10 MG tablet Commonly known as: LIPITOR 1/2 tab before bed 2x a week   b complex vitamins capsule Take 1 capsule by mouth daily.   esomeprazole 40 MG capsule Commonly known as: NEXIUM Take 40 mg by mouth daily before breakfast.   etodolac 400 MG tablet Commonly known as: LODINE Take 400 mg by mouth 2 (two) times daily as needed.   etonogestrel-ethinyl estradiol 0.12-0.015 MG/24HR vaginal ring Commonly known as: NuvaRing Insert vaginally and leave in place for 3 consecutive weeks, then remove for 1 week.   FISH OIL OMEGA-3 PO  Take 3 capsules by mouth daily.   fluticasone 50 MCG/ACT nasal spray Commonly known as: FLONASE Place into both nostrils daily.   GLUCOSAMINE 1500 COMPLEX PO Take by mouth.   loratadine 10 MG tablet Commonly known as: CLARITIN Take 10 mg by mouth daily.   multivitamin per tablet Take 1 tablet by mouth daily.   psyllium 58.6 % packet Commonly known as: METAMUCIL Take 1 packet by mouth daily.   Sodium Fluoride 5000 PPM 1.1 % Pste Generic drug: Sodium Fluoride See admin instructions.   tretinoin 0.025 % cream Commonly known as: RETIN-A Apply topically at bedtime. qhs to face   TURMERIC PO Take 2 tablets by mouth.        All past medical history, surgical history, allergies, family history, immunizations andmedications were updated in the EMR today and reviewed under the history and medication portions of their EMR.     ROS Negative, with  the exception of above mentioned in HPI   Objective:  BP 97/63   Pulse 84   Temp 98.5 F (36.9 C) (Oral)   Ht 5' 2.5" (1.588 m)   Wt 114 lb (51.7 kg)   SpO2 98%   BMI 20.52 kg/m  Body mass index is 20.52 kg/m. Physical Exam Vitals and nursing note reviewed.  Constitutional:      General: She is not in acute distress.    Appearance: Normal appearance. She is not ill-appearing, toxic-appearing or diaphoretic.  HENT:     Head: Normocephalic and atraumatic.  Eyes:     General: No scleral icterus.       Right eye: No discharge.        Left eye: No discharge.     Extraocular Movements: Extraocular movements intact.     Conjunctiva/sclera: Conjunctivae normal.     Pupils: Pupils are equal, round, and reactive to light.  Cardiovascular:     Rate and Rhythm: Normal rate and regular rhythm.  Pulmonary:     Effort: Pulmonary effort is normal. No respiratory distress.     Breath sounds: Normal breath sounds. No wheezing, rhonchi or rales.  Musculoskeletal:     Right lower leg: No edema.     Left lower leg: No edema.  Skin:    General: Skin is warm and dry.     Coloration: Skin is not jaundiced or pale.     Findings: No erythema or rash.  Neurological:     Mental Status: She is alert and oriented to person, place, and time. Mental status is at baseline.     Motor: No weakness.     Gait: Gait normal.  Psychiatric:        Mood and Affect: Mood normal.        Behavior: Behavior normal.        Thought Content: Thought content normal.        Judgment: Judgment normal.     No results found. No results found. No results found for this or any previous visit (from the past 24 hour(s)).  Assessment/Plan: Nichole Mcmahon is a 51 y.o. female present for OV for  Elevated triglycerides with high cholesterol/ FH: heart disease Continue atorvastatin 5 mg 2 times a week for the cardiovascular benefits since she had a elevated coronary calcium score. Reviewed lab results today for her.  Cmp and lipids.  82 % coronary ca score F/u yearly with CPE  Hypervitaminosis D/Osteopenia, unspecified location Vit d 1000u only- vit d normal now. - dexa > ordered for [facility]>  consider Fosamax or Prolia since she has had a fracture of her right femur  Reviewed expectations re: course of current medical issues. Discussed self-management of symptoms. Outlined signs and symptoms indicating need for more acute intervention. Patient verbalized understanding and all questions were answered. Patient received an After-Visit Summary.    No orders of the defined types were placed in this encounter.  No orders of the defined types were placed in this encounter.  Referral Orders  No referral(s) requested today     Note is dictated utilizing voice recognition software. Although note has been proof read prior to signing, occasional typographical errors still can be missed. If any questions arise, please do not hesitate to call for verification.   electronically signed by:  Howard Pouch, DO  [place]

## 2022-02-09 NOTE — Patient Instructions (Signed)
No follow-ups on file.        Great to see you today.  I have refilled the medication(s) we provide.   If labs were collected, we will inform you of lab results once received either by echart message or telephone call.   - echart message- for normal results that have been seen by the patient already.   - telephone call: abnormal results or if patient has not viewed results in their echart.  

## 2022-04-05 ENCOUNTER — Ambulatory Visit (INDEPENDENT_AMBULATORY_CARE_PROVIDER_SITE_OTHER): Payer: Commercial Managed Care - PPO | Admitting: Nurse Practitioner

## 2022-04-05 ENCOUNTER — Encounter: Payer: Self-pay | Admitting: Nurse Practitioner

## 2022-04-05 VITALS — BP 110/62 | HR 77 | Ht 62.21 in | Wt 115.0 lb

## 2022-04-05 DIAGNOSIS — Z3044 Encounter for surveillance of vaginal ring hormonal contraceptive device: Secondary | ICD-10-CM

## 2022-04-05 DIAGNOSIS — Z01419 Encounter for gynecological examination (general) (routine) without abnormal findings: Secondary | ICD-10-CM

## 2022-04-05 DIAGNOSIS — M85852 Other specified disorders of bone density and structure, left thigh: Secondary | ICD-10-CM

## 2022-04-05 MED ORDER — ETONOGESTREL-ETHINYL ESTRADIOL 0.12-0.015 MG/24HR VA RING
VAGINAL_RING | VAGINAL | 4 refills | Status: DC
Start: 2022-04-05 — End: 2023-04-06

## 2022-04-05 NOTE — Progress Notes (Signed)
   Nichole Mcmahon August 08, 1970 [id number]  History:  51 y.o. G1P1 presents for annual exam. Light monthly spotting on Nuvaring continuously. Normal pap and mammogram history. 2019 right hip fracture from fall. Osteopenia managed by PCP.   Gynecologic History No LMP recorded. (Menstrual status: Other).   Contraception/Family planning: NuvaRing vaginal inserts Sexually active: Yes  Health maintenance Last Pap: 02/27/2018. Results were: Normal neg HPV Last mammogram: 07/27/2021. Results were: Normal Last colonoscopy: 03/20/2020. Results were: Normal, 10-year recall Last Dexa: 04/11/2018. Results were: Z-score - 1.4  Past medical history, past surgical history, family history and social history were all reviewed and documented in the EPIC chart. Married. Works Engineer, petroleum at [employer]. 67 yo son living in [place], works remote in [place]:  A ROS was performed and pertinent positives and negatives are included.  Exam:  Vitals:   04/05/22 1157  BP: 110/62  Pulse: 77  SpO2: 100%  Weight: 115 lb (52.2 kg)  Height: 5' 2.21" (1.58 m)     Body mass index is 20.9 kg/m.  General appearance:  Normal Thyroid:  Symmetrical, normal in size, without palpable masses or nodularity. Respiratory  Auscultation:  Clear without wheezing or rhonchi Cardiovascular  Auscultation:  Regular rate, without rubs, murmurs or gallops  Edema/varicosities:  Not grossly evident Abdominal  Soft,nontender, without masses, guarding or rebound.  Liver/spleen:  No organomegaly noted  Hernia:  None appreciated  Skin  Inspection:  Grossly normal   Breasts: Examined lying and sitting.   Right: Without masses, retractions, discharge or axillary adenopathy.   Left: Without masses, retractions, discharge or axillary adenopathy. Genitourinary   Inguinal/mons:  Normal without inguinal adenopathy  External genitalia:  Normal appearing vulva with no masses, tenderness, or lesions  BUS/Urethra/Skene's glands:   Normal  Vagina:  Normal appearing with normal color and discharge, no lesions  Cervix:  Normal appearing without discharge or lesions  Uterus:  Normal in size, shape and contour.  Midline and mobile, nontender  Adnexa/parametria:     Rt: Normal in size, without masses or tenderness.   Lt: Normal in size, without masses or tenderness.  Anus and perineum: Normal  Digital rectal exam: Normal sphincter tone without palpated masses or tenderness  Patient informed chaperone available to be present for breast and pelvic exam. Patient has requested no chaperone to be present. Patient has been advised what will be completed during breast and pelvic exam.  Assessment/Plan:  51 y.o. G1P1 for annual exam.   Well female exam with routine gynecological exam - Education provided on SBEs, importance of preventative screenings, current guidelines, high calcium diet, regular exercise, and multivitamin daily. Labs with PCP.   Encounter for surveillance of vaginal ring hormonal contraceptive device - Plan: etonogestrel-ethinyl estradiol (NUVARING) 0.12-0.015 MG/24HR vaginal ring. Light monthly spotting. Taking continuously. Refill x 1 year provided.   Osteopenia, left femoral neck - 2019 right hip fracture from fall. Bone density afterwards showed osteopenia in that hip. Mother has osteoporosis. Taking daily vitamin D supplement and doing regular resistance training. Bone density managed by PCP. Scheduled for DXA next month.   Screening for cervical cancer - Normal pap history. Will repeat at 5-year interval per guidelines.   Screening for breast cancer - Normal mammogram history. Continue annual screenings. Normal breast exam today.   Screening for colon cancer - 03/2020 colonoscopy. Will repeat at GI's recommended interval.   Follow up in 1 year for annual.    Tamela Gammon [facility], 12:19 PM 04/05/2022

## 2022-04-10 IMAGING — CT CT CARDIAC CORONARY ARTERY CALCIUM SCORE
3 series · 14 of 20 positions shown, 16 images · non-contrast
Comparison: None available at time dictation

Addendum:
CLINICAL DATA: Cardiovascular Disease Risk stratification

EXAM:
Coronary Calcium Score
TECHNIQUE: A gated, non-contrast computed tomography scan of the heart was
performed using 3mm slice thickness. Axial images were analyzed on a
dedicated workstation. Calcium scoring of the coronary arteries was
performed using the Agatston method.

[Series 2: cascseq 2.0 sa36 (id) (id) · axial · 0.39mm/px · z∈[-258,-178]mm · 4 of 68 slices shown]
[im 14/68  vessel]
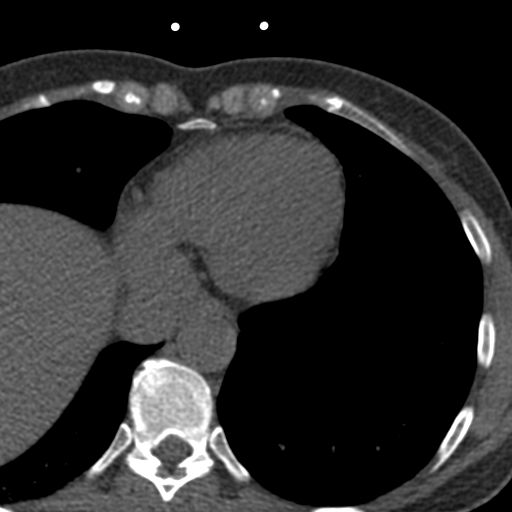
[im 27/68  vessel]
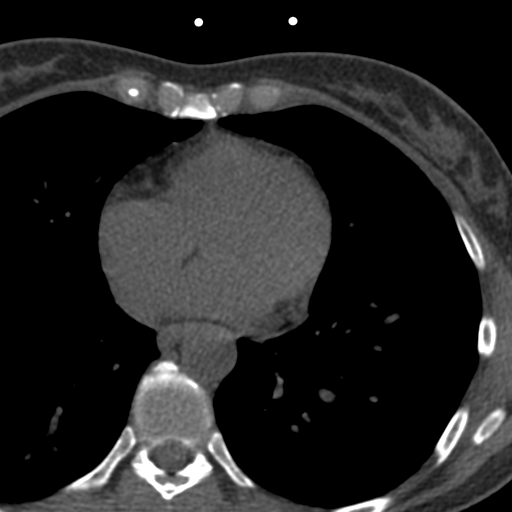
[im 41/68  vessel]
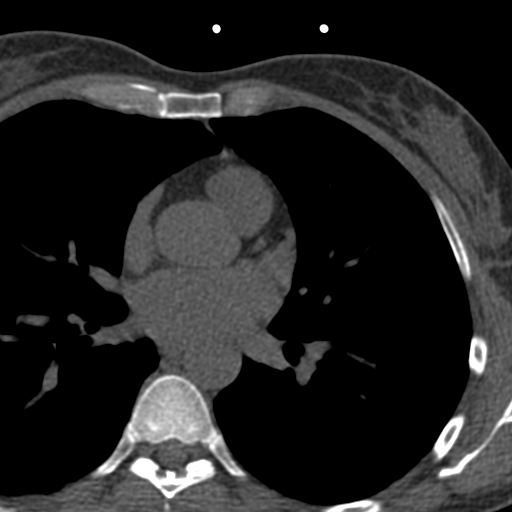
[im 54/68  vessel]
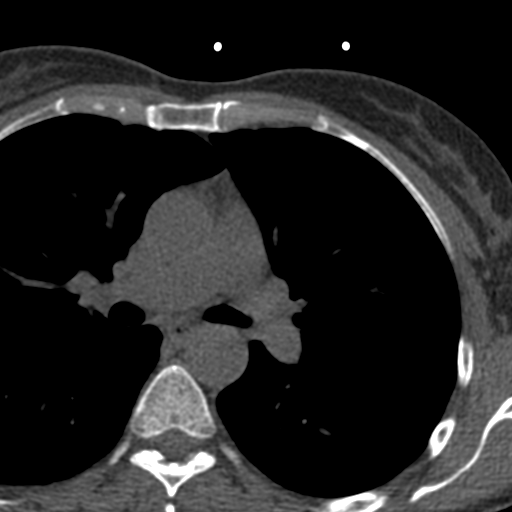

[Series 3: cascseq 2.0 bf37 st · axial · 0.60mm/px · z∈[-262,-174]mm · 5 of 68 slices shown, 7 images]
[im 12/68  vessel]
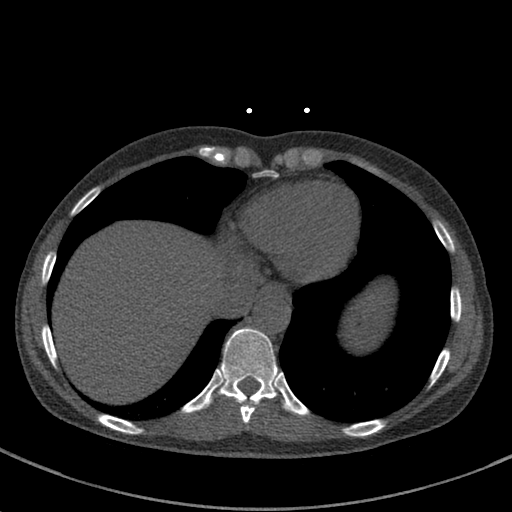
[im 12/68  lung]
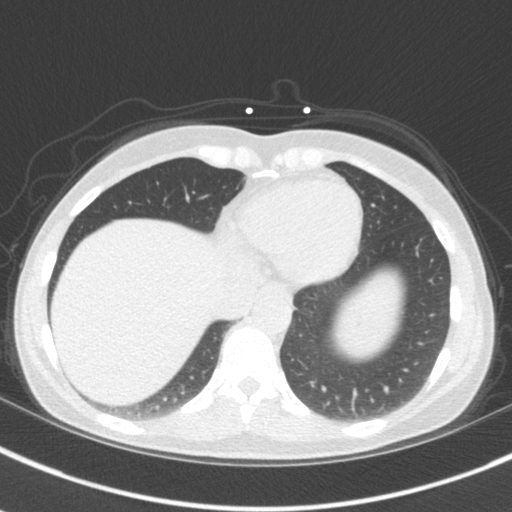
[im 23/68  vessel]
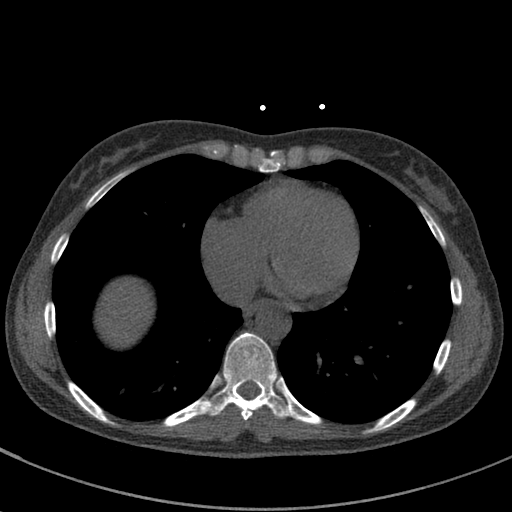
[im 34/68  vessel]
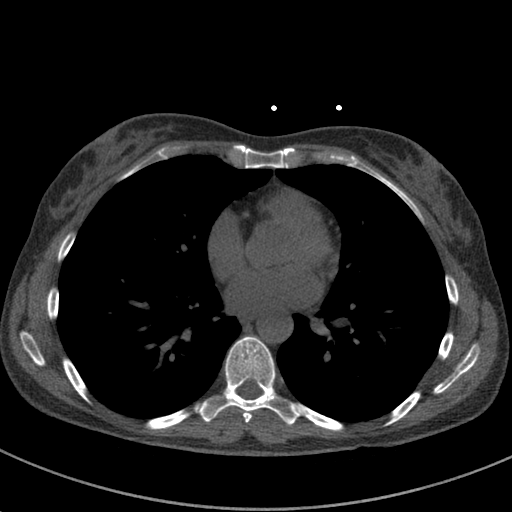
[im 45/68  vessel]
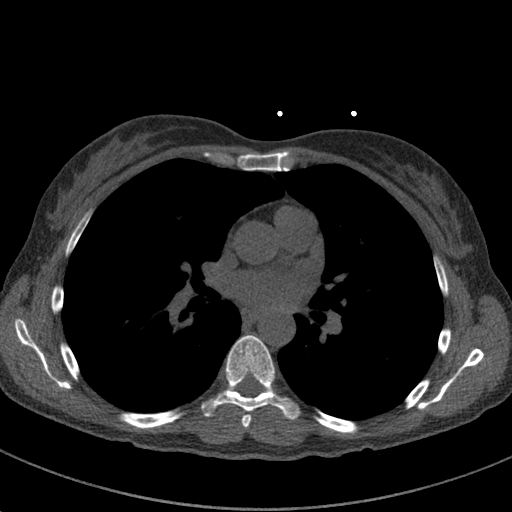
[im 56/68  vessel]
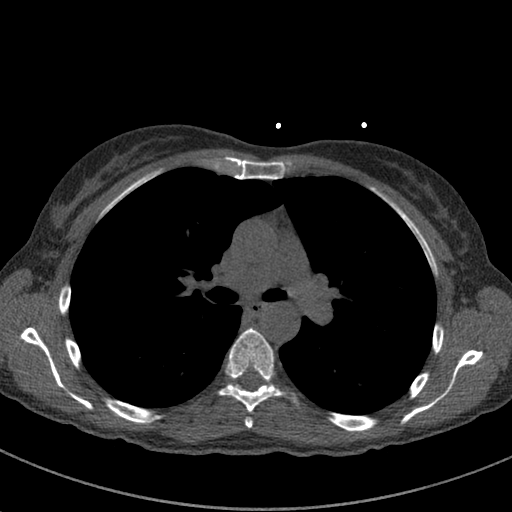
[im 56/68  lung]
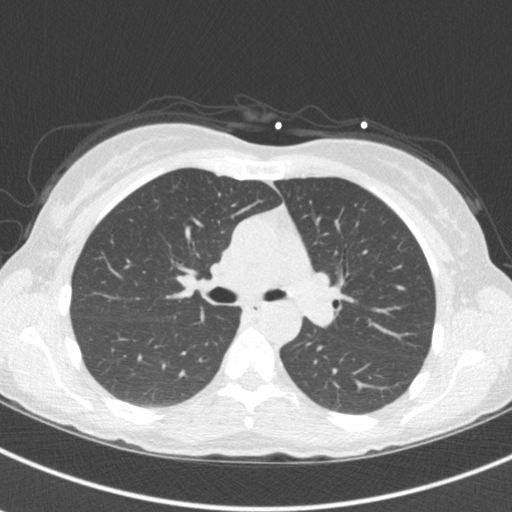

[Series 4: cascseq 2.0 br59 lung · axial · 0.60mm/px · z∈[-262,-174]mm · 5 of 68 slices shown]
[im 12/68  lung]
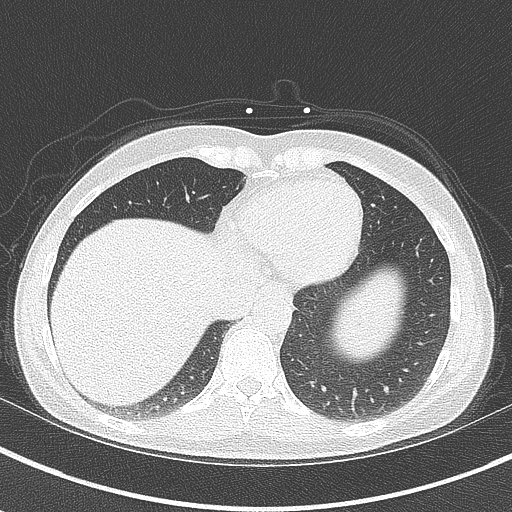
[im 23/68  lung]
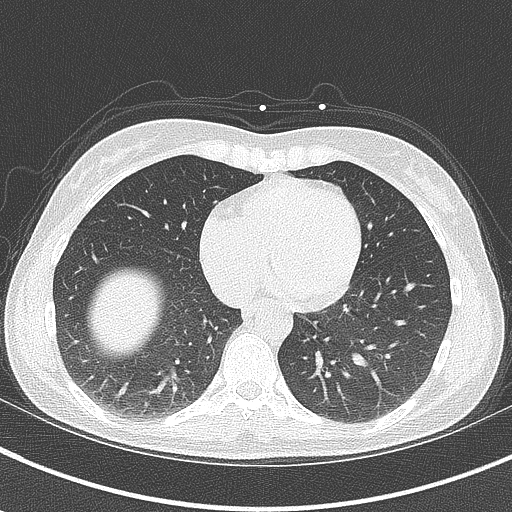
[im 34/68  lung]
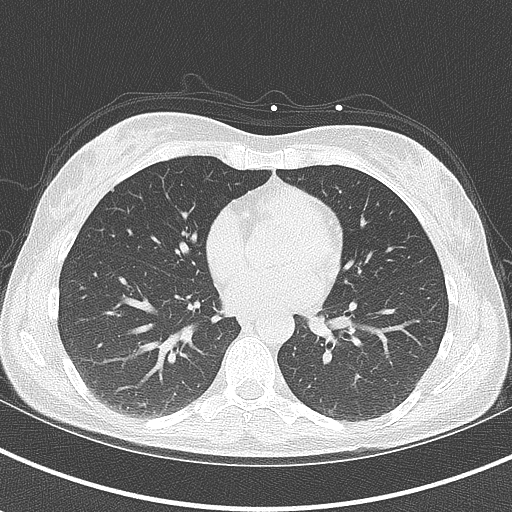
[im 45/68  lung]
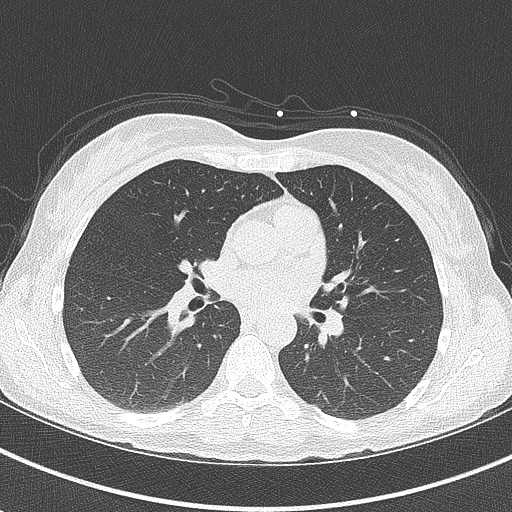
[im 56/68  lung]
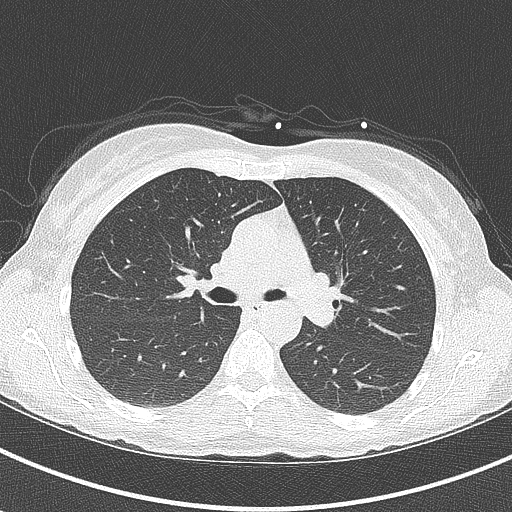

[14 of 20 positions shown; findings below may reference images not displayed]

FINDINGS: Coronary Calcium Score:

Left main: 0

Left anterior descending artery:

Left circumflex artery: 0

Right coronary artery: 0

Total:

Percentile: 82

Pericardium: Normal.

Ascending Aorta: Normal caliber.

Non-cardiac: See separate report from [REDACTED].
IMPRESSION: Coronary calcium score of 0.712. This was 82 percentile for age-,
race-, and sex-matched controls.



If CAC=0, it is reasonable to withhold statin therapy and reassess
in 5 to 10 years, as long as higher risk conditions are absent
(diabetes mellitus, family history of premature CHD in first degree
relatives (males <55 years; females <65 years), cigarette smoking,
or LDL >=190 mg/dL).

If CAC is 1 to 99, it is reasonable to initiate statin therapy for
patients >=55 years of age.

If CAC is >=100 or >=75th percentile, it is reasonable to initiate
statin therapy at any age.

Cardiology referral should be considered for patients with CAC
scores >=400 or >=75th percentile.

*4594 AHA/ACC/AACVPR/AAPA/ABC/DYLAN ALEXANDER/PERILLO/ARJONA/[place]/AUJLA/HELARIA/TATBIRT
Guideline on the Management of Blood Cholesterol: A Report of the
American College of Cardiology/American Heart Association Task Force
on Clinical Practice Guidelines. J Am Coll Cardiol.
4773;73(24):3730-3106.

ADDENDUM:
The following report is an over-read performed by radiologist Dr.
over-read does not include interpretation of cardiac or coronary
anatomy or pathology. The coronary calcium score/coronary CTA
interpretation by the cardiologist is attached.
FINDINGS: Vascular: Normal caliber thoracic aorta and central pulmonary
arteries.

Mediastinum/Nodes: No pathologically enlarged mediastinal, hilar or
axillary lymph nodes, noting limited sensitivity for the detection
of hilar adenopathy on this noncontrast study.

Lungs/Pleura: No suspicious pulmonary nodules or masses. No pleural
effusion. No pneumothorax.

Upper Abdomen: No acute finding.

Musculoskeletal: No acute osseous abnormality.
IMPRESSION: No acute abnormality or suspicious pulmonary nodule identified.

*** End of Addendum ***
FINDINGS: Coronary Calcium Score:

Left main: 0

Left anterior descending artery:

Left circumflex artery: 0

Right coronary artery: 0

Total:

Percentile: 82

Pericardium: Normal.

Ascending Aorta: Normal caliber.

Non-cardiac: See separate report from [REDACTED].
IMPRESSION: Coronary calcium score of 0.712. This was 82 percentile for age-,
race-, and sex-matched controls.



If CAC=0, it is reasonable to withhold statin therapy and reassess
in 5 to 10 years, as long as higher risk conditions are absent
(diabetes mellitus, family history of premature CHD in first degree
relatives (males <55 years; females <65 years), cigarette smoking,
or LDL >=190 mg/dL).

If CAC is 1 to 99, it is reasonable to initiate statin therapy for
patients >=55 years of age.

If CAC is >=100 or >=75th percentile, it is reasonable to initiate
statin therapy at any age.

Cardiology referral should be considered for patients with CAC
scores >=400 or >=75th percentile.

*4594 AHA/ACC/AACVPR/AAPA/ABC/DYLAN ALEXANDER/PERILLO/ARJONA/Takeichi/AUJLA/HELARIA/TATBIRT
Guideline on the Management of Blood Cholesterol: A Report of the
American College of Cardiology/American Heart Association Task Force
on Clinical Practice Guidelines. J Am Coll Cardiol.
4773;73(24):3730-3106.

## 2022-04-18 ENCOUNTER — Other Ambulatory Visit: Payer: Self-pay | Admitting: Nurse Practitioner

## 2022-04-18 DIAGNOSIS — Z1231 Encounter for screening mammogram for malignant neoplasm of breast: Secondary | ICD-10-CM

## 2022-04-29 ENCOUNTER — Ambulatory Visit
Admission: RE | Admit: 2022-04-29 | Discharge: 2022-04-29 | Disposition: A | Discharge status: Home / Self Care | Payer: Commercial Managed Care - PPO | Source: Ambulatory Visit | Attending: Family Medicine | Admitting: Family Medicine

## 2022-04-29 DIAGNOSIS — M8580 Other specified disorders of bone density and structure, unspecified site: Secondary | ICD-10-CM

## 2022-06-27 NOTE — Progress Notes (Signed)
Cardiology Office Note:    Date:  07/01/2022   ID:  Nichole Mcmahon, DOB 07-16-1970, MRN [id number]  PCP:  Nichole Hillock, DO   [facility] Providers Cardiologist:  None {  Referring MD: Nichole Hillock, DO    History of Present Illness:    Nichole Mcmahon is a 52 y.o. female with a hx of gestational DM, HLD, depression and family history of heart disease who presents to clinic for follow-up.  Patient was initially seen in 06/2021 for screening given her family history of CAD. Specifically her mother has a cardiovascular history including hypertension, hyperlipidemia, kidney transplant (kidney failure secondary to hypertension), CABG x2, pacemaker implant, and diabetes. Her maternal uncle died at 54 yo of a massive heart attack. Her brother underwent CABG x5 at 22 yo. She has a history of gestational DM.  Ca score 07/2021 0.712 (82%). She was continued on her lipitor 53m daily.  Today, the patient overall feels well. No chest pain, SOB, lightheadedness, dizziness or syncope. No LE edema. Remains active and feels well with activity. Aims for 1536m/week of exercise. Adheres to a healthy diet. Currently, taking lipitor 27m59mx/week. Last LDL 89.  Past Medical History:  Diagnosis Date   Blood transfusion without reported diagnosis    Depression    Displaced subtrochanteric fracture of right femur, [place]   Glaucoma    Hx gestational diabetes    Hyperlipidemia    Osteopenia     Past Surgical History:  Procedure Laterality Date   COLONOSCOPY  03/20/2020   [facility]   FEMUR IM NAIL Right 12/21/2017   Procedure: INTRAMEDULLARY (IM) NAIL FEMORAL;  Surgeon: HadShona NeedlesD;  [facility]  Laterality: Right;   RHINOPLASTY  2007    Current Medications: Current Meds  Medication Sig   atorvastatin (LIPITOR) 10 MG tablet Take 1 tablet (10 mg total) by mouth 3 (three) times a week.   b complex vitamins  capsule Take 1 capsule by mouth daily.   esomeprazole (NEXIUM) 40 MG capsule Take 40 mg by mouth daily before breakfast.     etodolac (LODINE) 400 MG tablet Take 400 mg by mouth 2 (two) times daily as needed.   etonogestrel-ethinyl estradiol (NUVARING) 0.12-0.015 MG/24HR vaginal ring Insert vaginally and leave in place for 3 consecutive weeks, then remove for 1 week.   fluticasone (FLONASE) 50 MCG/ACT nasal spray Place into both nostrils daily.   Glucosamine-Chondroit-Vit C-Mn (GLUCOSAMINE 1500 COMPLEX PO) Take by mouth.   loratadine (CLARITIN) 10 MG tablet Take 10 mg by mouth daily.   multivitamin (THERAGRAN) per tablet Take 1 tablet by mouth daily.   Omega-3 Fatty Acids (FISH OIL OMEGA-3 PO) Take 3 capsules by mouth daily.   psyllium (METAMUCIL) 58.6 % packet Take 1 packet by mouth daily.   SODIUM FLUORIDE 5000 PPM 1.1 % PSTE See admin instructions.   tretinoin (RETIN-A) 0.025 % cream Apply topically at bedtime. qhs to face   TURMERIC PO Take 2 tablets by mouth.   [DISCONTINUED] atorvastatin (LIPITOR) 10 MG tablet 1/2 tab before bed 2x a week     Allergies:   Patient has no known allergies.   Social History   Socioeconomic History   Marital status: Married    Spouse name: Not on file   Number of children: Not on file   Years of education: Not on file   Highest education level: Not on file  Occupational History  Not on file  Tobacco Use   Smoking status: Never   Smokeless tobacco: Never  Vaping Use   Vaping Use: Never used  Substance and Sexual Activity   Alcohol use: No    Alcohol/week: 0.0 standard drinks of alcohol   Drug use: No   Sexual activity: Yes    Birth control/protection: Inserts    Comment: MARRIED; DES NEG, DECLINED INSURANCE QUESTIONS  Other Topics Concern   Not on file  Social History Narrative   Married.   1 son.   Works for a [employer].   Enjoys swimming, going to the beach.    Social Determinants of Health   Financial Resource Strain:  Unknown (02/08/2022)   Overall Financial Resource Strain (CARDIA)    Difficulty of Paying Living Expenses: Patient refused  Food Insecurity: No Food Insecurity (02/08/2022)   Hunger Vital Sign    Worried About Running Out of Food in the Last Year: Never true    Ran Out of Food in the Last Year: Never true  Transportation Needs: No Transportation Needs (02/08/2022)   PRAPARE - Hydrologist (Medical): No    Lack of Transportation (Non-Medical): No  Physical Activity: Insufficiently Active (02/08/2022)   Exercise Vital Sign    Days of Exercise per Week: 4 days    Minutes of Exercise per Session: 20 min  Stress: No Stress Concern Present (02/08/2022)   [place]    Feeling of Stress : Not at all  Social Connections: Unknown (02/08/2022)   Social Connection and Isolation Panel [NHANES]    Frequency of Communication with Friends and Family: More than three times a week    Frequency of Social Gatherings with Friends and Family: Twice a week    Attends Religious Services: Patient refused    Marine scientist or Organizations: No    Attends Music therapist: Not on file    Marital Status: Married     Family History: The patient's family history includes Diabetes in her maternal grandmother and mother; Healthy in her son; Heart disease in her brother, maternal grandfather, and maternal grandmother; Hypertension in her maternal grandmother and mother; Other in her mother. There is no history of Colon cancer, Colon polyps, Esophageal cancer, Rectal cancer, or Stomach cancer.  ROS:   Review of Systems  Constitutional:  Negative for chills and fever.  HENT:  Negative for nosebleeds and tinnitus.   Eyes:  Negative for blurred vision and double vision.  Respiratory:  Negative for cough, hemoptysis and shortness of breath.   Cardiovascular:  Negative for chest pain, palpitations, orthopnea,  claudication, leg swelling and PND.  Gastrointestinal:  Negative for nausea and vomiting.  Genitourinary:  Negative for dysuria and urgency.  Musculoskeletal:  Negative for myalgias and neck pain.  Neurological:  Negative for dizziness and loss of consciousness.  Psychiatric/Behavioral:  Negative for depression.      EKGs/Labs/Other Studies Reviewed:    The following studies were reviewed today: Ca score 07/2021: FINDINGS: Coronary Calcium Score:   Left main: 0   Left anterior descending artery: 0.712   Left circumflex artery: 0   Right coronary artery: 0   Total: 0.712   Percentile: 82   Pericardium: Normal.   Ascending Aorta: Normal caliber.   Non-cardiac: See separate report from [facility].   IMPRESSION: Coronary calcium score of 0.712. This was 60 percentile for age-, race-, and sex-matched controls.   EKG:  EKG is personally reviewed.   06/16/2021: Sinus rhythm. Rate 79 bpm.   Recent Labs: 11/09/2021: TSH 4.07 11/12/2021: Hemoglobin 13.0; Platelets 359 02/04/2022: ALT 8; BUN 14; Creatinine, Ser 0.81; Potassium 4.3; Sodium 140   Recent Lipid Panel    Component Value Date/Time   CHOL 164 02/04/2022 0819   TRIG 129.0 02/04/2022 0819   HDL 49.20 02/04/2022 0819   CHOLHDL 3 02/04/2022 0819   VLDL 25.8 02/04/2022 0819   LDLCALC 89 02/04/2022 0819   LDLCALC 93 11/09/2021 1509     Risk Assessment/Calculations:           Physical Exam:    VS:  BP 126/73   Pulse 75   Ht 5' 3"$  (1.6 m)   Wt 117 lb 9.6 oz (53.3 kg)   SpO2 99%   BMI 20.83 kg/m     Wt Readings from Last 3 Encounters:  07/01/22 117 lb 9.6 oz (53.3 kg)  04/05/22 115 lb (52.2 kg)  02/09/22 114 lb (51.7 kg)     GEN: Well nourished, well developed in no acute distress HEENT: Normal NECK: No JVD; No carotid bruits CARDIAC: RRR, no murmurs, rubs or gallops RESPIRATORY:  Clear to auscultation without rales, wheezing or rhonchi  ABDOMEN: Soft, non-tender,  non-distended MUSCULOSKELETAL:  No edema; No deformity  SKIN: Warm and dry NEUROLOGIC:  Alert and oriented x 3 PSYCHIATRIC:  Normal affect   ASSESSMENT:    1. Family history of early CAD   2. FH: heart disease   3. Elevated triglycerides with high cholesterol    PLAN:    In order of problems listed above:  #Minimal Coronary Artery Ca: #Strong Family History of Premature CAD: Has very strong family history of CAD including her mother who had hypertension, hyperlipidemia, kidney transplant (kidney failure secondary to hypertension), CABG x2, pacemaker implant, and diabetes. Her maternal uncle died at 80 yo of a massive heart attack. Her brother underwent CABG x5 at 63 yo. She is fortunately asymptomatic and active. Ca score 0.7 (85%). Will continue with aggressive risk factor modifications -Continue lipitor 15m 3x/week  -Repeat lipids in 8 weeks; goal LDL<70 -Continue lifestyle modifications  #History of Gestational DM: Higher risk for CV complications. Currently not diabetic and BP well controlled. Will continue with regular CV follow-up for risk factor modification.  -Diet and lifestyle modifications as detailed below  Exercise recommendations: Goal of exercising for at least 30 minutes a day, at least 5 times per week.  Please exercise to a moderate exertion.  This means that while exercising it is difficult to speak in full sentences, however you are not so short of breath that you feel you must stop, and not so comfortable that you can carry on a full conversation.  Exertion level should be approximately a 5/10, if 10 is the most exertion you can perform.  Diet recommendations: Recommend a heart healthy diet such as the Mediterranean diet.  This diet consists of plant based foods, healthy fats, lean meats, olive oil.  It suggests limiting the intake of simple carbohydrates such as white breads, pastries, and pastas.  It also limits the amount of red meat, wine, and dairy products  such as cheese that one should consume on a daily basis.     Follow-up:  1 year  Medication Adjustments/Labs and Tests Ordered: Current medicines are reviewed at length with the patient today.  Concerns regarding medicines are outlined above.   Orders Placed This Encounter  Procedures   Lipid Profile   EKG  12-Lead   Meds ordered this encounter  Medications   atorvastatin (LIPITOR) 10 MG tablet    Sig: Take 1 tablet (10 mg total) by mouth 3 (three) times a week.    Dispense:  45 tablet    Refill:  1    Dose increase   Patient Instructions  Medication Instructions:   INCREASE YOUR LIPITOR TO 10 MG BY MOUTH THREE TIMES WEEKLY  *If you need a refill on your cardiac medications before your next appointment, please call your pharmacy*   Lab Work:  IN 8 WEEKS HERE IN THE OFFICE--LIPIDS--PLEASE COME FASTING TO THIS LAB APPOINTMENT  If you have labs (blood work) drawn today and your tests are completely normal, you will receive your results only by: [place] (if you have MyChart) OR A paper copy in the mail If you have any lab test that is abnormal or we need to change your treatment, we will call you to review the results.    Follow-Up: At [facility], you and your health needs are our priority.  As part of our continuing mission to provide you with exceptional heart care, we have created designated Provider Care Teams.  These Care Teams include your primary Cardiologist (physician) and Advanced Practice Providers (APPs -  Physician Assistants and Nurse Practitioners) who all work together to provide you with the care you need, when you need it.  We recommend signing up for the patient portal called "MyChart".  Sign up information is provided on this After Visit Summary.  MyChart is used to connect with patients for Virtual Visits (Telemedicine).  Patients are able to view lab/test results, encounter notes, upcoming appointments, etc.  Non-urgent messages can be  sent to your provider as well.   To learn more about what you can do with MyChart, go to [email or web address].    Your next appointment:   1 year(s)  Provider:   DR. Johney Frame      Signed, Freada Bergeron, MD  07/01/2022 8:53 AM    [place]

## 2022-07-01 ENCOUNTER — Encounter: Payer: Self-pay | Admitting: Cardiology

## 2022-07-01 ENCOUNTER — Ambulatory Visit: Payer: Commercial Managed Care - PPO | Attending: Cardiology | Admitting: Cardiology

## 2022-07-01 VITALS — BP 126/73 | HR 75 | Ht 63.0 in | Wt 117.6 lb

## 2022-07-01 DIAGNOSIS — E782 Mixed hyperlipidemia: Secondary | ICD-10-CM

## 2022-07-01 DIAGNOSIS — Z8249 Family history of ischemic heart disease and other diseases of the circulatory system: Secondary | ICD-10-CM | POA: Diagnosis not present

## 2022-07-01 MED ORDER — ATORVASTATIN CALCIUM 10 MG PO TABS: 10.0000 mg | ORAL_TABLET | ORAL | 1 refills | Status: DC

## 2022-07-01 NOTE — Patient Instructions (Signed)
Medication Instructions:   INCREASE YOUR LIPITOR TO 10 MG BY MOUTH THREE TIMES WEEKLY  *If you need a refill on your cardiac medications before your next appointment, please call your pharmacy*   Lab Work:  IN 8 WEEKS HERE IN THE OFFICE--LIPIDS--PLEASE COME FASTING TO THIS LAB APPOINTMENT  If you have labs (blood work) drawn today and your tests are completely normal, you will receive your results only by: Nichole Mcmahon (if you have MyChart) OR A paper copy in the mail If you have any lab test that is abnormal or we need to change your treatment, we will call you to review the results.    Follow-Up: At [facility], you and your health needs are our priority.  As part of our continuing mission to provide you with exceptional heart care, we have created designated Provider Care Teams.  These Care Teams include your primary Cardiologist (physician) and Advanced Practice Providers (APPs -  Physician Assistants and Nurse Practitioners) who all work together to provide you with the care you need, when you need it.  We recommend signing up for the patient portal called "MyChart".  Sign up information is provided on this After Visit Summary.  MyChart is used to connect with patients for Virtual Visits (Telemedicine).  Patients are able to view lab/test results, encounter notes, upcoming appointments, etc.  Non-urgent messages can be sent to your provider as well.   To learn more about what you can do with MyChart, go to [email or web address].    Your next appointment:   1 year(s)  Provider:   DR. Johney Frame

## 2022-07-12 ENCOUNTER — Ambulatory Visit (INDEPENDENT_AMBULATORY_CARE_PROVIDER_SITE_OTHER): Payer: Commercial Managed Care - PPO | Admitting: Dermatology

## 2022-07-12 VITALS — BP 104/64 | HR 73

## 2022-07-12 DIAGNOSIS — L814 Other melanin hyperpigmentation: Secondary | ICD-10-CM

## 2022-07-12 DIAGNOSIS — Z1283 Encounter for screening for malignant neoplasm of skin: Secondary | ICD-10-CM | POA: Diagnosis not present

## 2022-07-12 DIAGNOSIS — Z808 Family history of malignant neoplasm of other organs or systems: Secondary | ICD-10-CM

## 2022-07-12 DIAGNOSIS — L821 Other seborrheic keratosis: Secondary | ICD-10-CM

## 2022-07-12 DIAGNOSIS — B079 Viral wart, unspecified: Secondary | ICD-10-CM

## 2022-07-12 DIAGNOSIS — L818 Other specified disorders of pigmentation: Secondary | ICD-10-CM | POA: Diagnosis not present

## 2022-07-12 DIAGNOSIS — I8393 Asymptomatic varicose veins of bilateral lower extremities: Secondary | ICD-10-CM

## 2022-07-12 DIAGNOSIS — L988 Other specified disorders of the skin and subcutaneous tissue: Secondary | ICD-10-CM

## 2022-07-12 DIAGNOSIS — D229 Melanocytic nevi, unspecified: Secondary | ICD-10-CM | POA: Diagnosis not present

## 2022-07-12 DIAGNOSIS — L578 Other skin changes due to chronic exposure to nonionizing radiation: Secondary | ICD-10-CM

## 2022-07-12 DIAGNOSIS — D1801 Hemangioma of skin and subcutaneous tissue: Secondary | ICD-10-CM

## 2022-07-12 MED ORDER — TRETINOIN 0.025 % EX CREA: TOPICAL_CREAM | Freq: Every evening | CUTANEOUS | 8 refills | Status: AC

## 2022-07-12 NOTE — Progress Notes (Signed)
Follow-Up Visit   Subjective  Nichole Mcmahon is a 52 y.o. female who presents for the following: Annual Exam (1 year tbse, sk at back, hs of skin cancer, spot at left front ankle area. ).  The patient presents for Total-Body Skin Exam (TBSE) for skin cancer screening and mole check.  The patient has spots, moles and lesions to be evaluated, some may be new or changing and the patient has concerns that these could be cancer.    Benign-appearing.  Observation.  Call clinic for new or changing moles.  Recommend daily use of broad spectrum spf 30+ sunscreen to sun-exposed areas.     The following portions of the chart were reviewed this encounter and updated as appropriate:      Review of Systems: No other skin or systemic complaints except as noted in HPI or Assessment and Plan.   Objective  Well appearing patient in no apparent distress; mood and affect are within normal limits.  A full examination was performed including scalp, head, eyes, ears, nose, lips, neck, chest, axillae, abdomen, back, buttocks, bilateral upper extremities, bilateral lower extremities, hands, feet, fingers, toes, fingernails, and toenails. All findings within normal limits unless otherwise noted below.  face Rhytides and volume loss.   b/l arms Hypopigmented macules arms   right anterior thigh 0.3 cm medium dark brown macule   left buttock 0.2 cm medium dark brown macule   right lower abdomen 6 mm medium brown thin papule   Left Ankle - Anterior x 1 Verrucous papules -- Discussed viral etiology and contagion.    Assessment & Plan  Elastosis of skin face  Continue Tretinoin 0.025% cream QHS.    Topical retinoid medications like tretinoin/Retin-A, adapalene/Differin, tazarotene/Fabior, and Epiduo/Epiduo Forte can cause dryness and irritation when first started. Only apply a pea-sized amount to the entire affected area. Avoid applying it around the eyes, edges of mouth and creases at the nose.  If you experience irritation, use a good moisturizer first and/or apply the medicine less often. If you are doing well with the medicine, you can increase how often you use it until you are applying every night. Be careful with sun protection while using this medication as it can make you sensitive to the sun. This medicine should not be used by pregnant women.   Recommend daily broad spectrum sunscreen SPF 30+ to sun-exposed areas, reapply every 2 hours as needed. Call for new or changing lesions.  Staying in the shade or wearing long sleeves, sun glasses (UVA+UVB protection) and wide brim hats (4-inch brim around the entire circumference of the hat) are also recommended for sun protection.        tretinoin (RETIN-A) 0.025 % cream - face Apply topically at bedtime. qhs to face  Idiopathic guttate hypomelanosis b/l arms   Benign-appearing.  Observation.  Call clinic for new or changing lesions.  Recommend daily use of broad spectrum spf 30+ sunscreen to sun-exposed areas.   Nevus (3) left buttock; right anterior thigh; right lower abdomen  Benign-appearing. Stable compared to previous visit. Observation.  Call clinic for new or changing moles.  Recommend daily use of broad spectrum spf 30+ sunscreen to sun-exposed areas.    Viral warts, unspecified type Left Ankle - Anterior x 1  Vs Isk  Viral Wart (HPV) Counseling  Discussed viral / HPV (Human Papilloma Virus) etiology and risk of spread /infectivity to other areas of body as well as to other people.  Multiple treatments and methods may be  required to clear warts and it is possible treatment may not be successful.  Treatment risks include discoloration; scarring and there is still potential for wart recurrence.  Destruction of lesion - Left Ankle - Anterior x 1  Destruction method: cryotherapy   Informed consent: discussed and consent obtained   Lesion destroyed using liquid nitrogen: Yes   Region frozen until ice ball extended  beyond lesion: Yes   Outcome: patient tolerated procedure well with no complications   Post-procedure details: wound care instructions given   Additional details:  Prior to procedure, discussed risks of blister formation, small wound, skin dyspigmentation, or rare scar following cryotherapy. Recommend Vaseline ointment to treated areas while healing.   Lentigines - Scattered tan macules - Due to sun exposure - Benign-appearing, observe - Recommend daily broad spectrum sunscreen SPF 30+ to sun-exposed areas, reapply every 2 hours as needed. - Call for any changes  Seborrheic Keratoses - Stuck-on, waxy, tan-brown papules and/or plaques  - Benign-appearing - Discussed benign etiology and prognosis. - Observe - Call for any changes  Melanocytic Nevi - Tan-brown and/or pink-flesh-colored symmetric macules and papules - Benign appearing on exam today - Observation - Call clinic for new or changing moles - Recommend daily use of broad spectrum spf 30+ sunscreen to sun-exposed areas.   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 Larger vessels may be covered if symptomatic and we would refer to vascular surgeon if treatment desired.  Actinic Damage - Chronic condition, secondary to cumulative UV/sun exposure - diffuse scaly erythematous macules with underlying dyspigmentation - Recommend daily broad spectrum sunscreen SPF 30+ to sun-exposed areas, reapply every 2 hours as needed.  - Staying in  the shade or wearing long sleeves, sun glasses (UVA+UVB protection) and wide brim hats (4-inch brim around the entire circumference of the hat) are also recommended for sun protection.  - Call for new or changing lesions.  Family history of skin cancer - what type(s):melanoma  - who affected: mother   Skin cancer screening performed today. Return in about 1 year (around 07/13/2023) for TBSE.  I, Ruthell Rummage, CMA, am acting as scribe for Brendolyn Patty, MD.  Documentation: I have reviewed the above documentation for accuracy and completeness, and I agree with the above.  Brendolyn Patty MD

## 2022-07-12 NOTE — Patient Instructions (Addendum)
Cryotherapy Aftercare  Wash gently with soap and water everyday.   Apply Vaseline and Band-Aid daily until healed.   At left ankle   Viral Wart (HPV) Counseling  Discussed viral / HPV (Human Papilloma Virus) etiology and risk of spread /infectivity to other areas of body as well as to other people.  Multiple treatments and methods may be required to clear warts and it is possible treatment may not be successful.  Treatment risks include discoloration; scarring and there is still potential for wart recurrence.   Melanoma ABCDEs  Melanoma is the most dangerous type of skin cancer, and is the leading cause of death from skin disease.  You are more likely to develop melanoma if you: Have light-colored skin, light-colored eyes, or red or blond hair Spend a lot of time in the sun Tan regularly, either outdoors or in a tanning bed Have had blistering sunburns, especially during childhood Have a close family member who has had a melanoma Have atypical moles or large birthmarks  Early detection of melanoma is key since treatment is typically straightforward and cure rates are extremely high if we catch it early.   The first sign of melanoma is often a change in a mole or a new dark spot.  The ABCDE system is a way of remembering the signs of melanoma.  A for asymmetry:  The two halves do not match. B for border:  The edges of the growth are irregular. C for color:  A mixture of colors are present instead of an even brown color. D for diameter:  Melanomas are usually (but not always) greater than 70m - the size of a pencil eraser. E for evolution:  The spot keeps changing in size, shape, and color.  Please check your skin once per month between visits. You can use a small mirror in front and a large mirror behind you to keep an eye on the back side or your body.   If you see any new or changing lesions before your next follow-up, please call to schedule a visit.  Please continue daily skin  protection including broad spectrum sunscreen SPF 30+ to sun-exposed areas, reapplying every 2 hours as needed when you're outdoors.   Staying in the shade or wearing long sleeves, sun glasses (UVA+UVB protection) and wide brim hats (4-inch brim around the entire circumference of the hat) are also recommended for sun protection.     Due to recent changes in healthcare laws, you may see results of your pathology and/or laboratory studies on MyChart before the doctors have had a chance to review them. We understand that in some cases there may be results that are confusing or concerning to you. Please understand that not all results are received at the same time and often the doctors may need to interpret multiple results in order to provide you with the best plan of care or course of treatment. Therefore, we ask that you please give u[place]2 business days to thoroughly review all your results before contacting the office for clarification. Should we see a critical lab result, you will be contacted sooner.   If You Need Anything After Your Visit  If you have any questions or concerns for your doctor, please call our main line at [phone] press option 4 to reach your doctor's medical assistant. If no one answers, please leave a voicemail as directed and we will return your call as soon as possible. Messages left after 4 pm will be answered the  following business day.   You may also send [place] a message via [place]. We typically respond to MyChart messages within 1-2 business days.  For prescription refills, please ask your pharmacy to contact our office. Our fax number is [phone].  If you have an urgent issue when the clinic is closed that cannot wait until the next business day, you can page your doctor at the number below.    Please note that while we do our best to be available for urgent issues outside of office hours, we are not available 24/7.   If you have an urgent issue and are unable  to reach [place], you may choose to seek medical care at your doctor's office, retail clinic, urgent care center, or emergency room.  If you have a medical emergency, please immediately call 911 or go to the emergency department.  Pager Numbers  - Dr. Nehemiah Massed: [phone]  - Dr. Laurence Ferrari: [phone]  - Dr. Nicole Kindred: [phone]  In the event of inclement weather, please call our main line at [phone] for an update on the status of any delays or closures.  Dermatology Medication Tips: Please keep the boxes that topical medications come in in order to help keep track of the instructions about where and how to use these. Pharmacies typically print the medication instructions only on the boxes and not directly on the medication tubes.   If your medication is too expensive, please contact our office at [phone] option 4 or send [place] a message through [place].   We are unable to tell what your co-pay for medications will be in advance as this is different depending on your insurance coverage. However, we may be able to find a substitute medication at lower cost or fill out paperwork to get insurance to cover a needed medication.   If a prior authorization is required to get your medication covered by your insurance company, please allow [place] 1-2 business days to complete this process.  Drug prices often vary depending on where the prescription is filled and some pharmacies may offer cheaper prices.  The website www.goodrx.com contains coupons for medications through different pharmacies. The prices here do not account for what the cost may be with help from insurance (it may be cheaper with your insurance), but the website can give you the price if you did not use any insurance.  - You can print the associated coupon and take it with your prescription to the pharmacy.  - You may also stop by our office during regular business hours and pick up a GoodRx coupon card.  - If you need your  prescription sent electronically to a different pharmacy, notify our office through [facility] or by phone at [phone] option 4.     Si Usted Necesita Algo Despus de Su Visita  Tambin puede enviarnos un mensaje a travs de Pharmacist, community. Por lo general respondemos a los mensajes de MyChart en el transcurso de 1 a 2 das hbiles.  Para renovar recetas, por favor pida a su farmacia que se ponga en contacto con nuestra oficina. Harland Dingwall de fax es Center [phone].  Si tiene un asunto urgente cuando la clnica est cerrada y que no puede esperar hasta el siguiente da hbil, puede llamar/localizar a su doctor(a) al nmero que aparece a continuacin.   Por favor, tenga en cuenta que aunque hacemos todo lo posible para estar disponibles para asuntos urgentes fuera del horario de oficina, no estamos disponibles las 24 horas del da, los 7  das de [employer].   Si tiene un problema urgente y no puede comunicarse con nosotros, puede optar por buscar atencin mdica  en el consultorio de su doctor(a), en una clnica privada, en un centro de atencin urgente o en una sala de emergencias.  Si tiene Engineering geologist, por favor llame inmediatamente al 911 o vaya a la sala de emergencias.  Nmeros de bper  - Dr. Nehemiah Massed: [phone]  - Dra. Moye: [phone]  - Dra. Nicole Kindred: [phone]  En caso de inclemencias del [place], por favor llame a Johnsie Kindred principal al [phone] para una actualizacin sobre el [place] de cualquier retraso o cierre.  Consejos para la medicacin en dermatologa: Por favor, guarde las cajas en las que vienen los medicamentos de uso tpico para ayudarle a seguir las instrucciones sobre dnde y cmo usarlos. Las farmacias generalmente imprimen las instrucciones del medicamento slo en las cajas y no directamente en los tubos del [place].   Si su medicamento es muy caro, por favor, pngase en contacto con Zigmund Daniel llamando al [phone]  y presione la opcin 4 o envenos un mensaje a travs de Pharmacist, community.   No podemos decirle cul ser su copago por los medicamentos por adelantado ya que esto es diferente dependiendo de la cobertura de su seguro. Sin embargo, es posible que podamos encontrar un medicamento sustituto a Electrical engineer un formulario para que el seguro cubra el medicamento que se considera necesario.   Si se requiere una autorizacin previa para que su compaa de seguros [place] su medicamento, por favor permtanos de 1 a 2 das hbiles para completar este proceso.  Los precios de los medicamentos varan con frecuencia dependiendo del Environmental consultant de dnde se surte la receta y alguna farmacias pueden ofrecer precios ms baratos.  El sitio web www.goodrx.com tiene cupones para medicamentos de Airline pilot. Los precios aqu no tienen en cuenta lo que podra costar con la ayuda del seguro (puede ser ms barato con su seguro), pero el sitio web puede darle el precio si no utiliz Research scientist (physical sciences).  - Puede imprimir el cupn correspondiente y llevarlo con su receta a la farmacia.  - Tambin puede pasar por nuestra oficina durante el horario de atencin regular y Charity fundraiser una tarjeta de cupones de GoodRx.  - Si necesita que su receta se enve electrnicamente a una farmacia diferente, informe a nuestra oficina a travs de MyChart de [place] o por telfono llamando al [phone] y presione la opcin 4. Start clindamycin solution in the am. Pair with Cln acne wash, PanOyxl 4% creamy wash or [facility] hypochlorous spray to prevent antibiotic resistance.

## 2022-08-02 ENCOUNTER — Ambulatory Visit
Admission: RE | Admit: 2022-08-02 | Discharge: 2022-08-02 | Disposition: A | Discharge status: Home / Self Care | Payer: Commercial Managed Care - PPO | Source: Ambulatory Visit | Attending: Nurse Practitioner | Admitting: Nurse Practitioner

## 2022-08-02 DIAGNOSIS — Z1231 Encounter for screening mammogram for malignant neoplasm of breast: Secondary | ICD-10-CM

## 2022-08-26 ENCOUNTER — Ambulatory Visit: Payer: Commercial Managed Care - PPO | Attending: Cardiology

## 2022-08-26 DIAGNOSIS — Z8249 Family history of ischemic heart disease and other diseases of the circulatory system: Secondary | ICD-10-CM

## 2022-08-26 DIAGNOSIS — E782 Mixed hyperlipidemia: Secondary | ICD-10-CM

## 2022-08-27 LAB — LIPID PANEL
Chol/HDL Ratio: 3.2 ratio (ref 0.0–4.4)
Cholesterol, Total: 164 mg/dL (ref 100–199)
HDL: 52 mg/dL (ref >39)
LDL Chol Calc (NIH): 89 mg/dL (ref 0–99)
Triglycerides: 131 mg/dL (ref 0–149)
VLDL Cholesterol Cal: 23 mg/dL (ref 5–40)

## 2022-08-29 ENCOUNTER — Telehealth: Payer: Self-pay | Admitting: *Deleted

## 2022-08-29 DIAGNOSIS — Z8249 Family history of ischemic heart disease and other diseases of the circulatory system: Secondary | ICD-10-CM

## 2022-08-29 DIAGNOSIS — E782 Mixed hyperlipidemia: Secondary | ICD-10-CM

## 2022-08-29 DIAGNOSIS — Z79899 Other long term (current) drug therapy: Secondary | ICD-10-CM

## 2022-08-29 MED ORDER — ATORVASTATIN CALCIUM 10 MG PO TABS: ORAL_TABLET | ORAL | 2 refills | Status: DC

## 2022-08-29 NOTE — Telephone Encounter (Signed)
-----   Message from Meriam Sprague, MD sent at 08/29/2022  8:24 AM EDT ----- Cholesterol is slightly above goal. Is she willing to increase her lipitor to 5x/week and repeat lipids in 8 weeks to see if LDL at goal?

## 2022-08-29 NOTE — Telephone Encounter (Signed)
The patient has been notified of the result and verbalized understanding.  All questions (if any) were answered.   Pt states she is willing to increase her lipitor 10 mg to taking this 5 times a week and come in for repeat lipids in 8 weeks.  Confirmed the pharmacy of choice with the pt.  Scheduled the pt for repeat lipids in 8 weeks on 10/28/22.  She is aware to come fasting to this lab appt.   Pt verbalized understanding and agrees with this plan.

## 2022-08-30 ENCOUNTER — Encounter: Payer: Self-pay | Admitting: Cardiology

## 2022-08-30 DIAGNOSIS — Z8632 Personal history of gestational diabetes: Secondary | ICD-10-CM

## 2022-08-30 DIAGNOSIS — E782 Mixed hyperlipidemia: Secondary | ICD-10-CM

## 2022-08-30 DIAGNOSIS — Z8249 Family history of ischemic heart disease and other diseases of the circulatory system: Secondary | ICD-10-CM

## 2022-08-30 DIAGNOSIS — Z79899 Other long term (current) drug therapy: Secondary | ICD-10-CM

## 2022-08-31 NOTE — Telephone Encounter (Signed)
Meriam Sprague, MD  We can do the lipids, A1C, blood counts, CMET and TSH.  Thank you!!   Orders placed to 6/14 lab appt and the pt made aware of this via mychart.

## 2022-10-28 ENCOUNTER — Ambulatory Visit: Payer: Commercial Managed Care - PPO | Attending: Cardiology

## 2022-10-28 DIAGNOSIS — Z8249 Family history of ischemic heart disease and other diseases of the circulatory system: Secondary | ICD-10-CM

## 2022-10-28 DIAGNOSIS — E782 Mixed hyperlipidemia: Secondary | ICD-10-CM

## 2022-10-28 DIAGNOSIS — Z8632 Personal history of gestational diabetes: Secondary | ICD-10-CM

## 2022-10-28 DIAGNOSIS — Z79899 Other long term (current) drug therapy: Secondary | ICD-10-CM

## 2022-10-29 LAB — HEMOGLOBIN A1C
Est. average glucose Bld gHb Est-mCnc: 100 mg/dL
Hgb A1c MFr Bld: 5.1 % (ref 4.8–5.6)

## 2022-10-29 LAB — CBC WITH DIFFERENTIAL/PLATELET
Basophils Absolute: 0.1 10*3/uL (ref 0.0–0.2)
Basos: 1 %
EOS (ABSOLUTE): 0.1 10*3/uL (ref 0.0–0.4)
Eos: 1 %
Hematocrit: 37.8 % (ref 34.0–46.6)
Hemoglobin: 12.6 g/dL (ref 11.1–15.9)
Immature Grans (Abs): 0 10*3/uL (ref 0.0–0.1)
Immature Granulocytes: 0 %
Lymphocytes Absolute: 2.4 10*3/uL (ref 0.7–3.1)
Lymphs: 30 %
MCH: 30.1 pg (ref 26.6–33.0)
MCHC: 33.3 g/dL (ref 31.5–35.7)
MCV: 90 fL (ref 79–97)
Monocytes Absolute: 0.6 10*3/uL (ref 0.1–0.9)
Monocytes: 8 %
Neutrophils Absolute: 4.7 10*3/uL (ref 1.4–7.0)
Neutrophils: 60 %
Platelets: 335 10*3/uL (ref 150–450)
RBC: 4.18 x10E6/uL (ref 3.77–5.28)
RDW: 12.6 % (ref 11.7–15.4)
WBC: 7.9 10*3/uL (ref 3.4–10.8)

## 2022-10-29 LAB — LIPID PANEL
Chol/HDL Ratio: 2.6 ratio (ref 0.0–4.4)
Cholesterol, Total: 143 mg/dL (ref 100–199)
HDL: 54 mg/dL (ref >39)
LDL Chol Calc (NIH): 71 mg/dL (ref 0–99)
Triglycerides: 96 mg/dL (ref 0–149)
VLDL Cholesterol Cal: 18 mg/dL (ref 5–40)

## 2022-10-29 LAB — TSH: TSH: 4.14 u[IU]/mL (ref 0.450–4.500)

## 2022-10-29 LAB — COMPREHENSIVE METABOLIC PANEL
ALT: 13 IU/L (ref 0–32)
AST: 12 IU/L (ref 0–40)
Albumin/Globulin Ratio: 1.8
Albumin: 4.2 g/dL (ref 3.8–4.9)
Alkaline Phosphatase: 33 IU/L — ABNORMAL LOW (ref 44–121)
BUN/Creatinine Ratio: 18 (ref 9–23)
BUN: 12 mg/dL (ref 6–24)
Bilirubin Total: 0.3 mg/dL (ref 0.0–1.2)
CO2: 25 mmol/L (ref 20–29)
Calcium: 9.3 mg/dL (ref 8.7–10.2)
Chloride: 105 mmol/L (ref 96–106)
Creatinine, Ser: 0.65 mg/dL (ref 0.57–1.00)
Globulin, Total: 2.4 g/dL (ref 1.5–4.5)
Glucose: 93 mg/dL (ref 70–99)
Potassium: 4.4 mmol/L (ref 3.5–5.2)
Sodium: 139 mmol/L (ref 134–144)
Total Protein: 6.6 g/dL (ref 6.0–8.5)
eGFR: 106 mL/min/{1.73_m2} (ref >59)

## 2022-11-11 ENCOUNTER — Ambulatory Visit (INDEPENDENT_AMBULATORY_CARE_PROVIDER_SITE_OTHER): Payer: Commercial Managed Care - PPO | Admitting: Family Medicine

## 2022-11-11 ENCOUNTER — Encounter: Payer: Self-pay | Admitting: Family Medicine

## 2022-11-11 VITALS — BP 108/71 | HR 68 | Temp 98.7°F | Ht 63.0 in | Wt 115.4 lb

## 2022-11-11 DIAGNOSIS — Z8249 Family history of ischemic heart disease and other diseases of the circulatory system: Secondary | ICD-10-CM

## 2022-11-11 DIAGNOSIS — M8589 Other specified disorders of bone density and structure, multiple sites: Secondary | ICD-10-CM | POA: Diagnosis not present

## 2022-11-11 DIAGNOSIS — Z23 Encounter for immunization: Secondary | ICD-10-CM

## 2022-11-11 DIAGNOSIS — E782 Mixed hyperlipidemia: Secondary | ICD-10-CM

## 2022-11-11 DIAGNOSIS — Z0000 Encounter for general adult medical examination without abnormal findings: Secondary | ICD-10-CM | POA: Diagnosis not present

## 2022-11-11 LAB — VITAMIN D 25 HYDROXY (VIT D DEFICIENCY, FRACTURES): VITD: 80.95 ng/mL (ref 30.00–100.00)

## 2022-11-11 NOTE — Progress Notes (Signed)
Patient ID: Nichole Mcmahon, female  DOB: 10/15/1970, 52 y.o.   MRN: [id number] Patient Care Team    Relationship Specialty Notifications Start End  Natalia Leatherwood, DO PCP - General Family Medicine  11/06/20   Olivia Mackie, NP Nurse Practitioner Gynecology  11/06/20   Napoleon Form, MD Consulting Physician Gastroenterology  11/06/20   Meriam Sprague, MD Consulting Physician Cardiology  11/09/21     Chief Complaint  Patient presents with   Annual Exam    Labs completed on 06/14; insurance CPE form brought in today; declines shingles; mother recently past away    Subjective: Nichole Mcmahon is a 52 y.o.  Female  present for CPE  All past medical history, surgical history, allergies, family history, immunizations, medications and social history were updated in the electronic medical record today. All recent labs, ED visits and hospitalizations within the last year were reviewed.  Health maintenance:  Colonoscopy: no fhx. completed 03/20/2020, by Dr. Lavon Paganini, resutls normal. follow up 83yr. Mammogram: completed: 08/02/2022. ordered by GYN team.  Cervical cancer screening: last pap: 02/27/2018 results: GYN Immunizations: tdap UTD 12/2013, Influenza vaccine (encouraged yearly),declinded- shingrix. Ok to provide shingrix by nurse visit if desired.  Infectious disease screening: HIV and Hep C completed DEXA: Completed 04/29/2022-Z-score was normal Assistive device: none Oxygen AOZ:HYQM Patient has a Dental home. Hospitalizations/ED visits: reviewed     11/11/2022    8:08 AM 02/26/2021    9:37 AM 11/06/2020    8:12 AM  Depression screen PHQ 2/9  Decreased Interest 2 0 0  Down, Depressed, Hopeless 3 0 0  PHQ - 2 Score 5 0 0  Altered sleeping 1    Tired, decreased energy 3    Change in appetite 0    Feeling bad or failure about yourself  0    Trouble concentrating 0    Moving slowly or fidgety/restless 0    Suicidal thoughts 0    PHQ-9 Score 9    Difficult doing  work/chores Somewhat difficult        11/11/2022    8:08 AM  GAD 7 : Generalized Anxiety Score  Nervous, Anxious, on Edge 2  Control/stop worrying 2  Worry too much - different things 2  Trouble relaxing 2  Restless 0  Easily annoyed or irritable 0  Afraid - awful might happen 0  Total GAD 7 Score 8  Anxiety Difficulty Somewhat difficult    Immunization History  Administered Date(s) Administered   Hepatitis B, ADULT 01/03/2014, 03/07/2014, 07/10/2014   Influenza Inj Mdck Quad Pf 03/01/2017   Influenza Whole 03/17/2010, 02/24/2011   Influenza, Seasonal, Injecte, Preservative Fre 02/23/2018   Influenza,inj,Quad PF,6+ Mos 03/07/2014, 02/23/2018   PPD Test 01/03/2014   Tdap 01/03/2014   Past Medical History:  Diagnosis Date   Blood transfusion without reported diagnosis    Depression    Displaced subtrochanteric fracture of right femur, initial encounter for closed fracture (HCC) 12/20/2017   Glaucoma    Hx gestational diabetes    Hyperlipidemia    Osteopenia    No Known Allergies Past Surgical History:  Procedure Laterality Date   COLONOSCOPY  03/20/2020   K Nandigam   FEMUR IM NAIL Right 12/21/2017   Procedure: INTRAMEDULLARY (IM) NAIL FEMORAL;  Surgeon: Roby Lofts, MD;  Location: [facility] OR;  Service: Orthopedics;  Laterality: Right;   RHINOPLASTY  2007   Family History  Problem Relation Age of Onset   Diabetes Mother    Hypertension  Mother    Other Mother        kidney transplant due to HTN   Heart disease Brother    Healthy Son    Diabetes Maternal Grandmother    Hypertension Maternal Grandmother    Heart disease Maternal Grandmother    Heart disease Maternal Grandfather    Colon cancer Neg Hx    Colon polyps Neg Hx    Esophageal cancer Neg Hx    Rectal cancer Neg Hx    Stomach cancer Neg Hx    Social History   Social History Narrative   Married.   1 son.   Works for a [employer].   Enjoys swimming, going to the beach.     Allergies as of  11/11/2022   No Known Allergies      Medication List        Accurate as of November 11, 2022  8:17 AM. If you have any questions, ask your nurse or doctor.          STOP taking these medications    Sodium Fluoride 5000 PPM 1.1 % Pste Generic drug: Sodium Fluoride Stopped by: Felix Pacini, DO       TAKE these medications    atorvastatin 10 MG tablet Commonly known as: LIPITOR Take 1 tablet (10 mg total) by mouth daily 5 x/week.   b complex vitamins capsule Take 1 capsule by mouth daily.   esomeprazole 40 MG capsule Commonly known as: NEXIUM Take 40 mg by mouth daily before breakfast.   etodolac 400 MG tablet Commonly known as: LODINE Take 400 mg by mouth 2 (two) times daily as needed.   etonogestrel-ethinyl estradiol 0.12-0.015 MG/24HR vaginal ring Commonly known as: NuvaRing Insert vaginally and leave in place for 3 consecutive weeks, then remove for 1 week.   fluticasone 50 MCG/ACT nasal spray Commonly known as: FLONASE Place into both nostrils daily.   GLUCOSAMINE 1500 COMPLEX PO Take by mouth.   loratadine 10 MG tablet Commonly known as: CLARITIN Take 10 mg by mouth daily.   multivitamin per tablet Take 1 tablet by mouth daily.   psyllium 58.6 % packet Commonly known as: METAMUCIL Take 1 packet by mouth daily.   tretinoin 0.025 % cream Commonly known as: RETIN-A Apply topically at bedtime. qhs to face   TURMERIC PO Take 2 tablets by mouth.        All past medical history, surgical history, allergies, family history, immunizations andmedications were updated in the EMR today and reviewed under the history and medication portions of their EMR.      CT CARDIAC SCORING (SELF PAY ONLY) IMPRESSION: No acute abnormality or suspicious pulmonary nodule identified.   Result Date: 07/30/2021 FINDINGS: Coronary Calcium Score: Left main: 0  Left anterior descending artery: 0.712  Left circumflex artery: 0  Right coronary artery: 0  Total: 0.712   Percentile: 82  Pericardium: Normal. Ascending Aorta: Normal caliber.   ROS 14 pt review of systems performed and negative (unless mentioned in an HPI)  Objective: BP 108/71   Pulse 68   Temp 98.7 F (37.1 C)   Ht 5\' 3"  (1.6 m)   Wt 115 lb 6.4 oz (52.3 kg)   SpO2 100%   BMI 20.44 kg/m  Physical Exam Vitals and nursing note reviewed.  Constitutional:      General: She is not in acute distress.    Appearance: Normal appearance. She is not ill-appearing or toxic-appearing.  HENT:     Head: Normocephalic and atraumatic.  Right Ear: Tympanic membrane, ear canal and external ear normal. There is no impacted cerumen.     Left Ear: Tympanic membrane, ear canal and external ear normal. There is no impacted cerumen.     Nose: No congestion or rhinorrhea.     Mouth/Throat:     Mouth: Mucous membranes are moist.     Pharynx: Oropharynx is clear. No oropharyngeal exudate or posterior oropharyngeal erythema.  Eyes:     General: No scleral icterus.       Right eye: No discharge.        Left eye: No discharge.     Extraocular Movements: Extraocular movements intact.     Conjunctiva/sclera: Conjunctivae normal.     Pupils: Pupils are equal, round, and reactive to light.  Cardiovascular:     Rate and Rhythm: Normal rate and regular rhythm.     Pulses: Normal pulses.     Heart sounds: Normal heart sounds. No murmur heard.    No friction rub. No gallop.  Pulmonary:     Effort: Pulmonary effort is normal. No respiratory distress.     Breath sounds: Normal breath sounds. No stridor. No wheezing, rhonchi or rales.  Chest:     Chest wall: No tenderness.  Abdominal:     General: Abdomen is flat. Bowel sounds are normal. There is no distension.     Palpations: Abdomen is soft. There is no mass.     Tenderness: There is no abdominal tenderness. There is no right CVA tenderness, left CVA tenderness, guarding or rebound.     Hernia: No hernia is present.  Musculoskeletal:        General:  No swelling, tenderness or deformity. Normal range of motion.     Cervical back: Normal range of motion and neck supple. No rigidity or tenderness.     Right lower leg: No edema.     Left lower leg: No edema.  Lymphadenopathy:     Cervical: No cervical adenopathy.  Skin:    General: Skin is warm and dry.     Coloration: Skin is not jaundiced or pale.     Findings: No bruising, erythema, lesion or rash.  Neurological:     General: No focal deficit present.     Mental Status: She is alert and oriented to person, place, and time. Mental status is at baseline.     Cranial Nerves: No cranial nerve deficit.     Sensory: No sensory deficit.     Motor: No weakness.     Coordination: Coordination normal.     Gait: Gait normal.     Deep Tendon Reflexes: Reflexes normal.  Psychiatric:        Mood and Affect: Mood normal.        Behavior: Behavior normal.        Thought Content: Thought content normal.        Judgment: Judgment normal.      No results found.  Assessment/plan: ZAKYAH TEPEDINO is a 52 y.o. female present for CPE  Elevated triglycerides with high cholesterol/ FH: heart disease/ elevated coronary calcium score. 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   Routine general medical examination at a health care facility Patient was encouraged to exercise greater than 150 minutes a week. Patient was encouraged to choose a diet filled with fresh fruits and vegetables, and lean meats.  AVS provided to patient today for education/recommendation on gender specific health and safety maintenance. Colonoscopy: no fhx. completed 03/20/2020, by Dr. Lavon Paganini, resutls normal. follow up 37yr. Mammogram: completed: 08/02/2022. ordered by GYN team.  Cervical cancer screening: last pap: 02/27/2018 results:  GYN Immunizations: tdap UTD 12/2013, Influenza vaccine (encouraged yearly),declinded- shingrix. Ok to provide shingrix by nurse visit if desired.  Infectious disease screening: HIV and Hep C completed DEXA: Completed 04/29/2022-Z-score was normal -Reviewed recent labs collected by another provider 10/28/2022: CMP, lipid, CBC, A1c and TSH normal.  Return in about 1 year (around 11/12/2023) for cpe (20 min).    Orders Placed This Encounter  Procedures   VITAMIN D 25 Hydroxy (Vit-D Deficiency, Fractures)   No orders of the defined types were placed in this encounter.  Referral Orders  No referral(s) requested today     Electronically signed by: Felix Pacini, DO [facility] [place]

## 2022-11-11 NOTE — Patient Instructions (Signed)
No follow-ups on file.        Great to see you today.  I have refilled the medication(s) we provide.   If labs were collected, we will inform you of lab results once received either by echart message or telephone call.   - echart message- for normal results that have been seen by the patient already.   - telephone call: abnormal results or if patient has not viewed results in their echart.  

## 2023-02-15 ENCOUNTER — Other Ambulatory Visit (HOSPITAL_BASED_OUTPATIENT_CLINIC_OR_DEPARTMENT_OTHER): Payer: Self-pay | Admitting: *Deleted

## 2023-02-15 DIAGNOSIS — Z8249 Family history of ischemic heart disease and other diseases of the circulatory system: Secondary | ICD-10-CM

## 2023-02-15 DIAGNOSIS — E782 Mixed hyperlipidemia: Secondary | ICD-10-CM

## 2023-02-15 DIAGNOSIS — Z79899 Other long term (current) drug therapy: Secondary | ICD-10-CM

## 2023-02-15 MED ORDER — ATORVASTATIN CALCIUM 10 MG PO TABS: ORAL_TABLET | ORAL | 1 refills | Status: DC

## 2023-04-06 ENCOUNTER — Other Ambulatory Visit: Payer: Self-pay | Admitting: Nurse Practitioner

## 2023-04-06 DIAGNOSIS — Z3044 Encounter for surveillance of vaginal ring hormonal contraceptive device: Secondary | ICD-10-CM

## 2023-04-06 NOTE — Telephone Encounter (Signed)
Med refill request: Nuvaring Last AEX: 04/05/22 Next AEX: 04/27/23 Last MMG (if hormonal med) 08/02/22 Refill authorized: Please Advise, 33, 0 RF

## 2023-04-27 ENCOUNTER — Encounter: Payer: Self-pay | Admitting: Nurse Practitioner

## 2023-04-27 ENCOUNTER — Other Ambulatory Visit (HOSPITAL_COMMUNITY)
Admission: RE | Admit: 2023-04-27 | Discharge: 2023-04-27 | Disposition: A | Discharge status: Home / Self Care | Payer: Commercial Managed Care - PPO | Source: Ambulatory Visit | Attending: Nurse Practitioner | Admitting: Nurse Practitioner

## 2023-04-27 ENCOUNTER — Ambulatory Visit: Payer: Commercial Managed Care - PPO | Admitting: Nurse Practitioner

## 2023-04-27 VITALS — BP 122/64 | HR 77 | Ht 63.25 in | Wt 116.0 lb

## 2023-04-27 DIAGNOSIS — Z3044 Encounter for surveillance of vaginal ring hormonal contraceptive device: Secondary | ICD-10-CM | POA: Diagnosis not present

## 2023-04-27 DIAGNOSIS — Z124 Encounter for screening for malignant neoplasm of cervix: Secondary | ICD-10-CM | POA: Diagnosis present

## 2023-04-27 DIAGNOSIS — M8580 Other specified disorders of bone density and structure, unspecified site: Secondary | ICD-10-CM

## 2023-04-27 DIAGNOSIS — Z01419 Encounter for gynecological examination (general) (routine) without abnormal findings: Secondary | ICD-10-CM | POA: Diagnosis not present

## 2023-04-27 MED ORDER — ETONOGESTREL-ETHINYL ESTRADIOL 0.12-0.015 MG/24HR VA RING: VAGINAL_RING | VAGINAL | 4 refills | Status: DC

## 2023-04-27 NOTE — Progress Notes (Signed)
Nichole Mcmahon February 20, 1971 [id number]  History:  52 y.o. G1P1 presents for annual exam. Light monthly spotting on Nuvaring continuously. Had mid cycle spotting months June-August that she thinks were due to stress. Mother passed in June. Normal pap and mammogram history. 2019 right hip fracture from fall. Osteopenia managed by PCP.   Gynecologic History No LMP recorded. (Menstrual status: Other).   Contraception/Family planning: NuvaRing vaginal inserts Sexually active: Yes  Health maintenance Last Pap: 02/27/2018. Results were: Normal neg HPV Last mammogram: 08/02/2022. Results were: Normal Last colonoscopy: 03/20/2020. Results were: Normal, 10-year recall Last Dexa: 04/29/2022. Results were: T-score - 1.6  Past medical history, past surgical history, family history and social history were all reviewed and documented in the EPIC chart. Married. Works Psychologist, sport and exercise at [employer]. 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   ROS:  A ROS was performed and pertinent positives and negatives are included.  Exam:  Vitals:   04/27/23 1450  BP: 122/64  Pulse: 77  SpO2: 100%  Weight: 116 lb (52.6 kg)  Height: 5' 3.25" (1.607 m)      Body mass index is 20.39 kg/m.  General appearance:  Normal Thyroid:  Symmetrical, normal in size, without palpable masses or nodularity. Respiratory  Auscultation:  Clear without wheezing or rhonchi Cardiovascular  Auscultation:  Regular rate, without rubs, murmurs or gallops  Edema/varicosities:  Not grossly evident Abdominal  Soft,nontender, without masses, guarding or rebound.  Liver/spleen:  No organomegaly noted  Hernia:  None appreciated  Skin  Inspection:  Grossly normal   Breasts: Examined lying and sitting.   Right: Without masses, retractions, discharge or axillary adenopathy.   Left: Without masses, retractions, discharge or axillary adenopathy. Pelvic: External genitalia:  no lesions              Urethra:  normal appearing  urethra with no masses, tenderness or lesions              Bartholins and Skenes: normal                 Vagina: normal appearing vagina with normal color and discharge, no lesions              Cervix: no lesions Bimanual Exam:  Uterus:  no masses or tenderness              Adnexa: no mass, fullness, tenderness              Rectovaginal: Deferred              Anus:  normal, no lesions  Patient informed chaperone available to be present for breast and pelvic exam. Patient has requested no chaperone to be present. Patient has been advised what will be completed during breast and pelvic exam.  Assessment/Plan:  52 y.o. G1P1 for annual exam.   Well female exam with routine gynecological exam - Education provided on SBEs, importance of preventative screenings, current guidelines, high calcium diet, regular exercise, and multivitamin daily. Labs with PCP.   Encounter for surveillance of vaginal ring hormonal contraceptive device - Plan: etonogestrel-ethinyl estradiol (NUVARING) 0.12-0.015 MG/24HR vaginal ring. Light monthly spotting. BTB resolved. Will monitor. Taking continuously. Refill x 1 year provided.   Osteopenia, left femoral neck - 04/2022 T-score -1.6. 2019 right hip fracture from fall. Taking daily vitamin D supplement and doing regular resistance training. Bone density managed by PCP. Mother has osteoporosis.  Cervical cancer screening - Plan: Cytology - PAP( [facility]). Normal  pap history.  Screening for breast cancer - Normal mammogram history. Continue annual screenings. Normal breast exam today.   Screening for colon cancer - 03/2020 colonoscopy. Will repeat at GI's recommended interval.   Return in about 1 year (around 04/26/2024) for Annual.   Olivia Mackie [facility], 3:09 PM 04/27/2023

## 2023-04-28 LAB — CYTOLOGY - PAP
Adequacy: ABSENT
Comment: NEGATIVE
Diagnosis: NEGATIVE
High risk HPV: NEGATIVE

## 2023-05-26 ENCOUNTER — Other Ambulatory Visit: Payer: Self-pay | Admitting: Family Medicine

## 2023-05-26 DIAGNOSIS — Z1231 Encounter for screening mammogram for malignant neoplasm of breast: Secondary | ICD-10-CM

## 2023-07-06 NOTE — Progress Notes (Signed)
 Cardiology Office Note:  .   Date:  07/07/2023  ID:  Nichole Mcmahon, DOB Jun 22, 1970, MRN [id number] PCP: Natalia Leatherwood, DO  [facility] Providers Cardiologist:  Jodelle Red, MD {  History of Present Illness: .   Nichole Mcmahon is a 53 y.o. female with PMH gestational diabetes, hyperlipidemia, family history of heart disease. She was previously followed by Dr. Shari Prows and established care with me on 07/07/23.  Pertinent CV history: personal history of gestational diabetes. Strong FH of heart disease. Ca score in 2023 was 0.7 (82nd %ile). Has been on atorvastatin 5-10 mg daily. FH: mother had PMH kidney transplant (2/2 hypertensive kidney disease) that needed HD after kidney failed, CAD s/p CABG, valve replacement, type II diabetes, 2 pacemakers. Maternal uncle died age 77 of massive MI. Brother had CABGx5 at age 86.  Today: Asking about perimenopause symptoms and management. Mother passed away in 2023/12/06, menses started becoming irregular after that. Feels like in general the nuvaring manages symptoms well.  Does light weights 4 days/week, does cardio every day, aims for 150 minutes/week. Does a lot of stretching. Doesn't eat red meat/pork, eats fish, chicken, [place], vegetables, yogurt, etc.  ROS: Denies chest pain, shortness of breath at rest or with normal exertion. No PND, orthopnea, LE edema or unexpected weight gain. No syncope or palpitations. ROS otherwise negative except as noted.   Studies Reviewed: Marland Kitchen    EKG:  EKG Interpretation Date/Time:  Friday July 07 2023 08:19:04 EST Ventricular Rate:  71 PR Interval:  150 QRS Duration:  66 QT Interval:  382 QTC Calculation: 415 R Axis:   20  Text Interpretation: Normal sinus rhythm Low voltage QRS Septal infarct Possible Confirmed by Jodelle Red [phone]) on 07/07/2023 8:25:41 AM    Physical Exam:   VS:  BP 110/76   Pulse 62   Ht 5' 3.25" (1.607 m)   Wt 118 lb 9.6 oz (53.8 kg)   SpO2 98%   BMI  20.84 kg/m    Wt Readings from Last 3 Encounters:  07/07/23 118 lb 9.6 oz (53.8 kg)  04/27/23 116 lb (52.6 kg)  11/11/22 115 lb 6.4 oz (52.3 kg)    GEN: Well nourished, well developed in no acute distress HEENT: Normal, moist mucous membranes NECK: No JVD CARDIAC: regular rhythm, normal S1 and S2, no rubs or gallops. Very quiet systolic murmur. VASCULAR: Radial and DP pulses 2+ bilaterally. No carotid bruits RESPIRATORY:  Clear to auscultation without rales, wheezing or rhonchi  ABDOMEN: Soft, non-tender, non-distended MUSCULOSKELETAL:  Ambulates independently SKIN: Warm and dry, no edema NEUROLOGIC:  Alert and oriented x 3. No focal neuro deficits noted. PSYCHIATRIC:  Normal affect    ASSESSMENT AND PLAN: .    Coronary artery calcification, consistent with minimal nonobstructive CAD Hypercholesterolemia History of gestational diabetes Family history of premature CAD -lipids 12/06/2022 with LDL 71 -reviewed lifestyle recommendations, doing well with this -with strong family history, discussed checking lp(a), discussed what we know, discussed current limited treatment options but goal of long term targeted therapies -continue atorvastatin 10 mg daily (ok to take 5x/week)  CV risk counseling and prevention -recommend heart healthy/Mediterranean diet, with whole grains, fruits, vegetable, fish, lean meats, nuts, and olive oil. Limit salt. -recommend moderate walking, 3-5 times/week for 30-50 minutes each session. Aim for at least 150 minutes.week. Goal should be pace of 3 miles/hours, or walking 1.5 miles in 30 minutes -recommend avoidance of tobacco products. Avoid excess alcohol.  Dispo: 1 year or sooner as  needed  Signed, Jodelle Red, MD   Jodelle Red, MD, PhD, [facility]  [facility]    Heart & Vascular at [facility] [address], [address] [place], [place] [address] [phone]

## 2023-07-07 ENCOUNTER — Encounter (HOSPITAL_BASED_OUTPATIENT_CLINIC_OR_DEPARTMENT_OTHER): Payer: Self-pay | Admitting: Cardiology

## 2023-07-07 ENCOUNTER — Ambulatory Visit (HOSPITAL_BASED_OUTPATIENT_CLINIC_OR_DEPARTMENT_OTHER): Payer: Commercial Managed Care - PPO | Admitting: Cardiology

## 2023-07-07 VITALS — BP 110/76 | HR 62 | Ht 63.25 in | Wt 118.6 lb

## 2023-07-07 DIAGNOSIS — E7800 Pure hypercholesterolemia, unspecified: Secondary | ICD-10-CM | POA: Diagnosis not present

## 2023-07-07 DIAGNOSIS — I2510 Atherosclerotic heart disease of native coronary artery without angina pectoris: Secondary | ICD-10-CM | POA: Diagnosis not present

## 2023-07-07 DIAGNOSIS — Z7189 Other specified counseling: Secondary | ICD-10-CM

## 2023-07-07 DIAGNOSIS — Z8249 Family history of ischemic heart disease and other diseases of the circulatory system: Secondary | ICD-10-CM

## 2023-07-07 DIAGNOSIS — Z79899 Other long term (current) drug therapy: Secondary | ICD-10-CM

## 2023-07-07 DIAGNOSIS — Z8632 Personal history of gestational diabetes: Secondary | ICD-10-CM | POA: Diagnosis not present

## 2023-07-07 MED ORDER — ATORVASTATIN CALCIUM 10 MG PO TABS: ORAL_TABLET | ORAL | 3 refills | Status: DC

## 2023-07-07 NOTE — Patient Instructions (Signed)
 Medication Instructions:  Your physician recommends that you continue on your current medications as directed. Please refer to the Current Medication list given to you today.  Lab Work: Your physician recommends that you return for lab work in June: CMP, A1C, LIPIDS and LPA   Follow-Up: At [facility], you and your health needs are our priority.  As part of our continuing mission to provide you with exceptional heart care, we have created designated Provider Care Teams.  These Care Teams include your primary Cardiologist (physician) and Advanced Practice Providers (APPs -  Physician Assistants and Nurse Practitioners) who all work together to provide you with the care you need, when you need it.  We recommend signing up for the patient portal called "MyChart".  Sign up information is provided on this After Visit Summary.  MyChart is used to connect with patients for Virtual Visits (Telemedicine).  Patients are able to view lab/test results, encounter notes, upcoming appointments, etc.  Non-urgent messages can be sent to your provider as well.   To learn more about what you can do with MyChart, go to [email or web address].    Your next appointment:   1 year  Provider:   Jodelle Red, MD

## 2023-07-11 ENCOUNTER — Ambulatory Visit: Payer: Commercial Managed Care - PPO | Admitting: Dermatology

## 2023-07-12 ENCOUNTER — Ambulatory Visit: Payer: Commercial Managed Care - PPO | Admitting: Dermatology

## 2023-07-26 ENCOUNTER — Encounter: Payer: Self-pay | Admitting: Dermatology

## 2023-07-26 ENCOUNTER — Ambulatory Visit: Payer: Commercial Managed Care - PPO | Admitting: Dermatology

## 2023-07-26 VITALS — BP 113/64 | HR 76

## 2023-07-26 DIAGNOSIS — L579 Skin changes due to chronic exposure to nonionizing radiation, unspecified: Secondary | ICD-10-CM

## 2023-07-26 DIAGNOSIS — Z808 Family history of malignant neoplasm of other organs or systems: Secondary | ICD-10-CM

## 2023-07-26 DIAGNOSIS — Z1283 Encounter for screening for malignant neoplasm of skin: Secondary | ICD-10-CM | POA: Diagnosis not present

## 2023-07-26 DIAGNOSIS — W908XXA Exposure to other nonionizing radiation, initial encounter: Secondary | ICD-10-CM

## 2023-07-26 DIAGNOSIS — L821 Other seborrheic keratosis: Secondary | ICD-10-CM | POA: Diagnosis not present

## 2023-07-26 DIAGNOSIS — D1801 Hemangioma of skin and subcutaneous tissue: Secondary | ICD-10-CM | POA: Diagnosis not present

## 2023-07-26 DIAGNOSIS — L578 Other skin changes due to chronic exposure to nonionizing radiation: Secondary | ICD-10-CM

## 2023-07-26 DIAGNOSIS — L814 Other melanin hyperpigmentation: Secondary | ICD-10-CM

## 2023-07-26 DIAGNOSIS — I781 Nevus, non-neoplastic: Secondary | ICD-10-CM

## 2023-07-26 DIAGNOSIS — D229 Melanocytic nevi, unspecified: Secondary | ICD-10-CM

## 2023-07-26 MED ORDER — TRETINOIN 0.025 % EX CREA: TOPICAL_CREAM | Freq: Every day | CUTANEOUS | 2 refills | Status: AC

## 2023-07-26 NOTE — Progress Notes (Signed)
 New Patient Visit   Subjective  Nichole Mcmahon is a 53 y.o. female who presents for the following: Skin Cancer Screening and Full Body Skin Exam  The patient presents for Total-Body Skin Exam (TBSE) for skin cancer screening and mole check. The patient has spots, moles and lesions to be evaluated, some may be new or changing. Pt has no hx of skin cancer, family hx of NMSC  The following portions of the chart were reviewed this encounter and updated as appropriate: medications, allergies, medical history  Review of Systems:  No other skin or systemic complaints except as noted in HPI or Assessment and Plan.  Objective  Well appearing patient in no apparent distress; mood and affect are within normal limits.  A full examination was performed including scalp, head, eyes, ears, nose, lips, neck, chest, axillae, abdomen, back, buttocks, bilateral upper extremities, bilateral lower extremities, hands, feet, fingers, toes, fingernails, and toenails. All findings within normal limits unless otherwise noted below.   Relevant physical exam findings are noted in the Assessment and Plan.    Assessment & Plan   SKIN CANCER SCREENING PERFORMED TODAY.  ACTINIC DAMAGE - Chronic condition, secondary to cumulative UV/sun exposure - diffuse scaly erythematous macules with underlying dyspigmentation - Recommend daily broad spectrum sunscreen SPF 30+ to sun-exposed areas, reapply every 2 hours as needed.  - Staying in the shade or wearing long sleeves, sun glasses (UVA+UVB protection) and wide brim hats (4-inch brim around the entire circumference of the hat) are also recommended for sun protection.  - Call for new or changing lesions.  MELANOCYTIC NEVI - Tan-brown and/or pink-flesh-colored symmetric macules and papules - Benign appearing on exam today - Observation - Call clinic for new or changing moles - Recommend daily use of broad spectrum spf 30+ sunscreen to sun-exposed areas.    LENTIGINES Exam: scattered tan macules Due to sun exposure Treatment Plan: Benign-appearing, observe. Recommend daily broad spectrum sunscreen SPF 30+ to sun-exposed areas, reapply every 2 hours as needed.  Call for any changes   HEMANGIOMA Exam: red papule(s) Discussed benign nature. Recommend observation. Call for changes.   SEBORRHEIC KERATOSIS - Stuck-on, waxy, tan-brown papules and/or plaques  - Benign-appearing - Discussed benign etiology and prognosis. - Observe - Call for any changes  Photoaging The patient was counseled about photoaging, which refers to the premature aging of the skin caused by prolonged sun exposure over time. It was explained that photoaging can lead to wrinkles, fine lines, uneven pigmentation, and loss of skin elasticity. To address these concerns, the patient was advised to begin using tretinoin cream, a topical retinoid that can help reduce the appearance of fine lines, improve skin texture, and promote collagen production. The patient was informed that tretinoin works by increasing skin cell turnover and can be effective in improving the signs of photoaging. It was emphasized that tretinoin should be applied in a thin layer to the affected areas at night, and the patient should use sunscreen daily, as the skin becomes more sensitive to the sun during treatment. Side effects such as dryness, peeling, or redness were discussed, and the patient was advised to start slowly, applying the cream every other night initially to minimize irritation. Regular follow-up appointments were recommended to assess the progress and make adjustments to the treatment as needed.  Telangiectasias- face The patient was counseled about telangiectasias, which are small, dilated blood vessels near the surface of the skin, often appearing as red or purple lines, and commonly found on the  face, particularly around the nose and cheeks. It was explained that telangiectasias can be caused  by factors such as sun exposure, aging, or certain skin conditions like rosacea. Treatment options were discussed, including pulsed dye laser (PDL) or broadband light (BBL) therapy, both of which can effectively reduce the appearance of these blood vessels by targeting and closing the dilated vessels. The patient was informed that both treatments are non-invasive and generally well-tolerated, though some redness or mild swelling may occur immediately following the procedure. It was emphasized that multiple sessions may be required to achieve the best results, and the patient was advised to avoid sun exposure and use sunscreen regularly to prevent further development of telangiectasias. Follow-up was recommended to assess the effectiveness of the treatment and determine if additional sessions are necessary.     Return in about 1 year (around 07/25/2024) for TBSE.  I, Tillie Fantasia, CMA, am acting as scribe for Gwenith Daily, MD.   Documentation: I have reviewed the above documentation for accuracy and completeness, and I agree with the above.  Gwenith Daily, MD

## 2023-07-26 NOTE — Patient Instructions (Addendum)
 PDL laser or BBL for blood vessels on the face   Try [facility]  Skin Education : We counseled the patient regarding the following: Sun screen (SPF 30 or greater) should be applied during peak UV exposure (between 10am and 2pm) and reapplied after exercise or swimming.  The ABCDEs of melanoma were reviewed with the patient, and the importance of monthly self-examination of moles was emphasized. Should any moles change in shape or color, or itch, bleed or burn, pt will contact our office for evaluation sooner then their interval appointment.  Plan: Sunscreen Recommendations We recommended a broad spectrum sunscreen with a SPF of 30 or higher. SPF 30 sunscreens block approximately 97 percent of the sun's harmful rays. Sunscreens should be applied at least 15 minutes prior to expected sun exposure and then every 2 hours after that as long as sun exposure continues. If swimming or exercising sunscreen should be reapplied every 45 minutes to an hour after getting wet or sweating. One ounce, or the equivalent of a shot glass full of sunscreen, is adequate to protect the skin not covered by a bathing suit. We also recommended a lip balm with a sunscreen as well. Sun protective clothing can be used in lieu of sunscreen but must be worn the entire time you are exposed to the sun's rays.   Important Information   Due to recent changes in healthcare laws, you may see results of your pathology and/or laboratory studies on MyChart before the doctors have had a chance to review them. We understand that in some cases there may be results that are confusing or concerning to you. Please understand that not all results are received at the same time and often the doctors may need to interpret multiple results in order to provide you with the best plan of care or course of treatment. Therefore, we ask that you please give [place] 2 business days to thoroughly review all your results before contacting the office for  clarification. Should we see a critical lab result, you will be contacted sooner.     If You Need Anything After Your Visit   If you have any questions or concerns for your doctor, please call our main line at [phone]. If no one answers, please leave a voicemail as directed and we will return your call as soon as possible. Messages left after 4 pm will be answered the following business day.    You may also send [place] a message via MyChart. We typically respond to MyChart messages within 1-2 business days.  For prescription refills, please ask your pharmacy to contact our office. Our fax number is [phone].  If you have an urgent issue when the clinic is closed that cannot wait until the next business day, you can page your doctor at the number below.     Please note that while we do our best to be available for urgent issues outside of office hours, we are not available 24/7.    If you have an urgent issue and are unable to reach [place], you may choose to seek medical care at your doctor's office, retail clinic, urgent care center, or emergency room.   If you have a medical emergency, please immediately call 911 or go to the emergency department. In the event of inclement weather, please call our main line at [phone] for an update on the status of any delays or closures.  Dermatology Medication Tips: Please keep the boxes that topical medications come in in  order to help keep track of the instructions about where and how to use these. Pharmacies typically print the medication instructions only on the boxes and not directly on the medication tubes.   If your medication is too expensive, please contact our office at [phone] or send [place] a message through MyChart.    We are unable to tell what your co-pay for medications will be in advance as this is different depending on your insurance coverage. However, we may be able to find a substitute medication at lower cost or fill out  paperwork to get insurance to cover a needed medication.    If a prior authorization is required to get your medication covered by your insurance company, please allow [place] 1-2 business days to complete this process.   Drug prices often vary depending on where the prescription is filled and some pharmacies may offer cheaper prices.   The website www.goodrx.com contains coupons for medications through different pharmacies. The prices here do not account for what the cost may be with help from insurance (it may be cheaper with your insurance), but the website can give you the price if you did not use any insurance.  - You can print the associated coupon and take it with your prescription to the pharmacy.  - You may also stop by our office during regular business hours and pick up a GoodRx coupon card.  - If you need your prescription sent electronically to a different pharmacy, notify our office through [facility] or by phone at [phone]

## 2023-08-09 ENCOUNTER — Ambulatory Visit: Payer: Commercial Managed Care - PPO

## 2023-08-11 ENCOUNTER — Ambulatory Visit
Admission: RE | Admit: 2023-08-11 | Discharge: 2023-08-11 | Disposition: A | Discharge status: Home / Self Care | Source: Ambulatory Visit | Attending: Family Medicine | Admitting: Family Medicine

## 2023-08-11 ENCOUNTER — Telehealth: Payer: Self-pay

## 2023-08-11 DIAGNOSIS — Z1231 Encounter for screening mammogram for malignant neoplasm of breast: Secondary | ICD-10-CM

## 2023-08-11 DIAGNOSIS — Z0279 Encounter for issue of other medical certificate: Secondary | ICD-10-CM

## 2023-08-11 NOTE — Telephone Encounter (Signed)
Placed in PCP office for review/signature

## 2023-08-11 NOTE — Telephone Encounter (Signed)
 Patient dropped wellness form to be completed and signed by Dr. Claiborne Billings. Patient had last CPE on 11/11/22.  Please call.  Type of forms received: Wellness Form  Routed to: Team [employer] received by :  Annabelle Harman   Individual made aware of 5-7 business day turn around (Y/N): y  Form completed and patient made aware of charges(Y/N): n  Faxed to :   Form location:  [facility] inbox front office

## 2023-08-14 ENCOUNTER — Telehealth: Payer: Self-pay | Admitting: Family Medicine

## 2023-08-14 NOTE — Telephone Encounter (Signed)
 Received physical/biometric form to complete for patient-last physical 10/2022.  Completed and placed in CMA workstation for her completion.

## 2023-08-14 NOTE — Telephone Encounter (Signed)
 Pt notified forms completed; forms faxed.

## 2023-08-15 ENCOUNTER — Encounter: Payer: Self-pay | Admitting: Family Medicine

## 2023-08-20 ENCOUNTER — Encounter: Payer: Self-pay | Admitting: Family Medicine

## 2023-08-21 NOTE — Telephone Encounter (Signed)
 Forms printed

## 2023-08-28 ENCOUNTER — Telehealth: Payer: Self-pay

## 2023-08-28 NOTE — Telephone Encounter (Signed)
 Copied from CRM [id number]. Topic: General - Billing Inquiry >> Aug 25, 2023 11:02 AM Allyne Areola wrote: Reason for CRM: Patient is calling because she received a bill for $29 for forms that were filled out, she was not notified prior and her husband was not charged, she would like an explanation and see if there is anything we can do since she was not notified of the charge upfront.

## 2023-08-31 NOTE — Telephone Encounter (Signed)
 Please advise, pt states she has never been charged to have physical forms completed. Her husband had same forms completed and was not charged.

## 2023-09-04 NOTE — Telephone Encounter (Addendum)
 Advised pth that PCP was not in office today

## 2023-09-05 NOTE — Telephone Encounter (Signed)
 I am not certain why she was charged, and review of the forms that needed completed, these did not seem complicated and we had seen her within the past year and had ordered those labs.  We can we have the fee for her. That being said, all forms have the potential for charge, and all patients trying a form should have been notified of that.

## 2023-09-06 NOTE — Telephone Encounter (Signed)
 Spoke with patient regarding results/recommendations.

## 2023-09-27 ENCOUNTER — Encounter (HOSPITAL_BASED_OUTPATIENT_CLINIC_OR_DEPARTMENT_OTHER): Payer: Self-pay

## 2023-10-31 LAB — LIPID PANEL
Chol/HDL Ratio: 3.5 ratio (ref 0.0–4.4)
Cholesterol, Total: 161 mg/dL (ref 100–199)
HDL: 46 mg/dL (ref >39)
LDL Chol Calc (NIH): 87 mg/dL (ref 0–99)
Triglycerides: 159 mg/dL — ABNORMAL HIGH (ref 0–149)
VLDL Cholesterol Cal: 28 mg/dL (ref 5–40)

## 2023-10-31 LAB — LIPOPROTEIN A (LPA): Lipoprotein (a): 126.8 nmol/L — ABNORMAL HIGH (ref <75.0)

## 2023-10-31 LAB — HEMOGLOBIN A1C
Est. average glucose Bld gHb Est-mCnc: 105 mg/dL
Hgb A1c MFr Bld: 5.3 % (ref 4.8–5.6)

## 2023-11-07 ENCOUNTER — Encounter (HOSPITAL_BASED_OUTPATIENT_CLINIC_OR_DEPARTMENT_OTHER): Payer: Self-pay

## 2023-11-22 ENCOUNTER — Encounter (HOSPITAL_BASED_OUTPATIENT_CLINIC_OR_DEPARTMENT_OTHER): Payer: Self-pay

## 2023-11-24 ENCOUNTER — Encounter: Payer: Commercial Managed Care - PPO | Admitting: Family Medicine

## 2023-11-28 ENCOUNTER — Ambulatory Visit (INDEPENDENT_AMBULATORY_CARE_PROVIDER_SITE_OTHER): Admitting: Family Medicine

## 2023-11-28 ENCOUNTER — Ambulatory Visit: Payer: Self-pay | Admitting: Family Medicine

## 2023-11-28 ENCOUNTER — Encounter: Payer: Self-pay | Admitting: Family Medicine

## 2023-11-28 VITALS — BP 129/76 | HR 66 | Temp 98.1°F | Ht 63.0 in | Wt 116.6 lb

## 2023-11-28 DIAGNOSIS — Z23 Encounter for immunization: Secondary | ICD-10-CM | POA: Diagnosis not present

## 2023-11-28 DIAGNOSIS — E782 Mixed hyperlipidemia: Secondary | ICD-10-CM | POA: Diagnosis not present

## 2023-11-28 DIAGNOSIS — Z0000 Encounter for general adult medical examination without abnormal findings: Secondary | ICD-10-CM | POA: Diagnosis not present

## 2023-11-28 DIAGNOSIS — Z8249 Family history of ischemic heart disease and other diseases of the circulatory system: Secondary | ICD-10-CM | POA: Diagnosis not present

## 2023-11-28 DIAGNOSIS — Z79899 Other long term (current) drug therapy: Secondary | ICD-10-CM

## 2023-11-28 LAB — COMPREHENSIVE METABOLIC PANEL WITH GFR
ALT: 8 U/L (ref 0–35)
AST: 10 U/L (ref 0–37)
Albumin: 4.5 g/dL (ref 3.5–5.2)
Alkaline Phosphatase: 36 U/L — ABNORMAL LOW (ref 39–117)
BUN: 13 mg/dL (ref 6–23)
CO2: 29 meq/L (ref 19–32)
Calcium: 9.2 mg/dL (ref 8.4–10.5)
Chloride: 100 meq/L (ref 96–112)
Creatinine, Ser: 0.78 mg/dL (ref 0.40–1.20)
GFR: 86.48 mL/min (ref >60.00)
Glucose, Bld: 95 mg/dL (ref 70–99)
Potassium: 3.5 meq/L (ref 3.5–5.1)
Sodium: 137 meq/L (ref 135–145)
Total Bilirubin: 0.4 mg/dL (ref 0.2–1.2)
Total Protein: 7.2 g/dL (ref 6.0–8.3)

## 2023-11-28 LAB — CBC
HCT: 40.7 % (ref 36.0–46.0)
Hemoglobin: 13.8 g/dL (ref 12.0–15.0)
MCHC: 34 g/dL (ref 30.0–36.0)
MCV: 92.3 fl (ref 78.0–100.0)
Platelets: 378 K/uL (ref 150.0–400.0)
RBC: 4.41 Mil/uL (ref 3.87–5.11)
RDW: 13.1 % (ref 11.5–15.5)
WBC: 9.2 K/uL (ref 4.0–10.5)

## 2023-11-28 LAB — VITAMIN D 25 HYDROXY (VIT D DEFICIENCY, FRACTURES): VITD: 78.01 ng/mL (ref 30.00–100.00)

## 2023-11-28 LAB — TSH: TSH: 3.69 u[IU]/mL (ref 0.35–5.50)

## 2023-11-28 MED ORDER — ATORVASTATIN CALCIUM 10 MG PO TABS: 10.0000 mg | ORAL_TABLET | Freq: Every day | ORAL | 3 refills | Status: AC

## 2023-11-28 NOTE — Progress Notes (Signed)
 Patient ID: Nichole Mcmahon, female  DOB: [birth year], 52 y.o.   MRN: [id number] Patient Care Team    Relationship Specialty Notifications Start End  Nichole Mcmahon LABOR, DO PCP - General Family Medicine  11/06/20   Nichole Slain, MD PCP - Cardiology Cardiology  07/07/23   Nichole Mcmahon LABOR, NP Nurse Practitioner Gynecology  11/06/20   Nichole Gustav GAILS, MD Consulting Physician Gastroenterology  11/06/20     Chief Complaint  Patient presents with   Annual Exam    Pt is not fasting.     Subjective: Nichole Mcmahon is a 53 y.o.  Female  present for CPE  All past medical history, surgical history, allergies, family history, immunizations, medications and social history were updated in the electronic medical record today. All recent labs, ED visits and hospitalizations within the last year were reviewed.  Health maintenance:  Colonoscopy: no fhx. completed 03/20/2020, by Dr. Shila, resutls normal. follow up 26yr. Mammogram: completed: 08/11/2023. ordered by GYN team.  Cervical cancer screening: last pap: 112/04/2023 results: GYN Immunizations: tdap UTD 12/2013-declined today, Influenza vaccine (encouraged yearly),declinded- shingrix. Ok to provide shingrix by nurse visit if desired.  Infectious disease screening: HIV and Hep C completed DEXA: Completed 04/29/2022-Z-score was normal Assistive device: none Oxygen [id number] Patient has a Dental home. Hospitalizations/ED visits: reviewed     11/28/2023   10:51 AM 11/11/2022    8:08 AM 02/26/2021    9:37 AM 11/06/2020    8:12 AM  Depression screen PHQ 2/9  Decreased Interest 0 2 0 0  Down, Depressed, Hopeless 0 3 0 0  PHQ - 2 Score 0 5 0 0  Altered sleeping 1 1    Tired, decreased energy 1 3    Change in appetite 0 0    Feeling bad or failure about yourself  0 0    Trouble concentrating 0 0    Moving slowly or fidgety/restless 0 0    Suicidal thoughts 0 0    PHQ-9 Score 2 9    Difficult doing work/chores Not difficult at  all Somewhat difficult        11/28/2023   10:52 AM 11/11/2022    8:08 AM  GAD 7 : Generalized Anxiety Score  Nervous, Anxious, on Edge 1 2  Control/stop worrying 1 2  Worry too much - different things 1 2  Trouble relaxing 1 2  Restless 0 0  Easily annoyed or irritable 0 0  Afraid - awful might happen 0 0  Total GAD 7 Score 4 8  Anxiety Difficulty Not difficult at all Somewhat difficult    Immunization History  Administered Date(s) Administered   Hepatitis B, ADULT 01/03/2014, 03/07/2014, 07/10/2014   Influenza Inj Mdck Quad Pf 03/01/2017   Influenza Whole 03/17/2010, 02/24/2011   Influenza, Seasonal, Injecte, Preservative Fre 02/23/2018   Influenza,inj,Quad PF,6+ Mos 03/07/2014, 02/23/2018   PPD Test 01/03/2014   Tdap 01/03/2014   Past Medical History:  Diagnosis Date   Blood transfusion without reported diagnosis    Depression    Displaced subtrochanteric fracture of right femur, initial encounter for closed fracture (HCC) 12/20/2017   Glaucoma    Hx gestational diabetes    Hyperlipidemia    Osteopenia    No Known Allergies Past Surgical History:  Procedure Laterality Date   COLONOSCOPY  03/20/2020   K N[facility]am   FEMUR IM NAIL Right 12/21/2017   Procedure: INTRAMEDULLARY (IM) NAIL FEMORAL;  Surgeon: Kendal Franky SQUIBB, MD;  Location: [facility] OR;  Service:  Orthopedics;  Laterality: Right;   RHINOPLASTY  2007   Family History  Problem Relation Age of Onset   Diabetes Mother    Hypertension Mother    Other Mother        kidney transplant due to HTN   Heart disease Brother    Healthy Son    Diabetes Maternal Grandmother    Hypertension Maternal Grandmother    Heart disease Maternal Grandmother    Heart disease Maternal Grandfather    Colon cancer Neg Hx    Colon polyps Neg Hx    Esophageal cancer Neg Hx    Rectal cancer Neg Hx    Stomach cancer Neg Hx    Social History   Social History Narrative   Married.   1 son.   Works for a [employer].   Enjoys  swimming, going to the beach.     Allergies as of 11/28/2023   No Known Allergies      Medication List        Accurate as of November 28, 2023 11:21 AM. If you have any questions, ask your nurse or doctor.          atorvastatin  10 MG tablet Commonly known as: LIPITOR Take 1 tablet (10 mg total) by mouth at bedtime. Take 1 tablet (10 mg total) by mouth daily 5 x/week. What changed:  how much to take how to take this when to take this Changed by: Nichole Mcmahon   b complex vitamins capsule Take 1 capsule by mouth daily.   esomeprazole 40 MG capsule Commonly known as: NEXIUM Take 40 mg by mouth daily before breakfast.   etodolac 400 MG tablet Commonly known as: LODINE Take 400 mg by mouth 2 (two) times daily as needed.   etonogestrel -ethinyl estradiol  0.12-0.015 MG/24HR vaginal ring Commonly known as: NUVARING INSERT 1 RING VAGINALLY AS DIRECTED. REMOVE AFTER 3 WEEKS & WAIT 7 DAYS BEFORE INSERTING A NEW RING   fluticasone 50 MCG/ACT nasal spray Commonly known as: FLONASE Place into both nostrils daily.   GLUCOSAMINE 1500 COMPLEX PO Take by mouth.   loratadine 10 MG tablet Commonly known as: CLARITIN Take 10 mg by mouth daily.   multivitamin per tablet Take 1 tablet by mouth daily.   psyllium 58.6 % packet Commonly known as: METAMUCIL Take 1 packet by mouth daily.   tretinoin  0.025 % cream Commonly known as: RETIN-A  Apply topically at bedtime.   TURMERIC PO Take 2 tablets by mouth.   VITAMIN D -3 PO Take by mouth.        All past medical history, surgical history, allergies, family history, immunizations andmedications were updated in the EMR today and reviewed under the history and medication portions of their EMR.      CT CARDIAC SCORING (SELF PAY ONLY) IMPRESSION: No acute abnormality or suspicious pulmonary nodule identified.   Result Date: 07/30/2021 FINDINGS: Coronary Calcium  Score: Left main: 0  Left anterior descending artery: 0.712  Left  circumflex artery: 0  Right coronary artery: 0  Total: 0.712  Percentile: 82  Pericardium: Normal. Ascending Aorta: Normal caliber.   ROS 14 pt review of systems performed and negative (unless mentioned in an HPI)  Objective: BP 129/76   Pulse 66   Temp 98.1 F (36.7 C)   Ht 5' 3 (1.6 m)   Wt 116 lb 9.6 oz (52.9 kg)   SpO2 100%   BMI 20.65 kg/m  Physical Exam Vitals and nursing note reviewed.  Constitutional:  General: She is not in acute distress.    Appearance: Normal appearance. She is not ill-appearing or toxic-appearing.  HENT:     Head: Normocephalic and atraumatic.     Right Ear: Tympanic membrane, ear canal and external ear normal. There is no impacted cerumen.     Left Ear: Tympanic membrane, ear canal and external ear normal. There is no impacted cerumen.     Nose: No congestion or rhinorrhea.     Mouth/Throat:     Mouth: Mucous membranes are moist.     Pharynx: Oropharynx is clear. No oropharyngeal exudate or posterior oropharyngeal erythema.  Eyes:     General: No scleral icterus.       Right eye: No discharge.        Left eye: No discharge.     Extraocular Movements: Extraocular movements intact.     Conjunctiva/sclera: Conjunctivae normal.     Pupils: Pupils are equal, round, and reactive to light.  Cardiovascular:     Rate and Rhythm: Normal rate and regular rhythm.     Pulses: Normal pulses.     Heart sounds: Normal heart sounds. No murmur heard.    No friction rub. No gallop.  Pulmonary:     Effort: Pulmonary effort is normal. No respiratory distress.     Breath sounds: Normal breath sounds. No stridor. No wheezing, rhonchi or rales.  Chest:     Chest wall: No tenderness.  Abdominal:     General: Abdomen is flat. Bowel sounds are normal. There is no distension.     Palpations: Abdomen is soft. There is no mass.     Tenderness: There is no abdominal tenderness. There is no right CVA tenderness, left CVA tenderness, guarding or rebound.      Hernia: No hernia is present.  Musculoskeletal:        General: No swelling, tenderness or deformity. Normal range of motion.     Cervical back: Normal range of motion and neck supple. No rigidity or tenderness.     Right lower leg: No edema.     Left lower leg: No edema.  Lymphadenopathy:     Cervical: No cervical adenopathy.  Skin:    General: Skin is warm and dry.     Coloration: Skin is not jaundiced or pale.     Findings: No bruising, erythema, lesion or rash.  Neurological:     General: No focal deficit present.     Mental Status: She is alert and oriented to person, place, and time. Mental status is at baseline.     Cranial Nerves: No cranial nerve deficit.     Sensory: No sensory deficit.     Motor: No weakness.     Coordination: Coordination normal.     Gait: Gait normal.     Deep Tendon Reflexes: Reflexes normal.  Psychiatric:        Mood and Affect: Mood normal.        Behavior: Behavior normal.        Thought Content: Thought content normal.        Judgment: Judgment normal.      No results found.  Assessment/plan: AMOUR TRIGG is a 53 y.o. female present for CPE  Elevated triglycerides with high cholesterol/ FH: heart disease/ elevated coronary calcium  score. Continue  atorvastatin  10mg  > increased to 7 days a week. Reviewed lipo pro A and lipid panel, A1c collected at cardiology last month.  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   Routine general medical examination at a health care facility Patient was encouraged to exercise greater than 150 minutes a week. Patient was encouraged to choose a diet filled with fresh fruits and vegetables, and lean meats. AVS provided to patient today for education/recommendation on gender specific health and safety maintenance. Colonoscopy: no fhx. completed 03/20/2020, by Dr.  Shila, resutls normal. follow up 5yr. Mammogram: completed: 08/02/2022. ordered by GYN team.  Cervical cancer screening: last pap: 02/27/2018 results: GYN Immunizations: tdap UTD 12/2013, Influenza vaccine (encouraged yearly),declinded- shingrix. Ok to provide shingrix by nurse visit if desired.  Infectious disease screening: HIV and Hep C completed DEXA: Completed 04/29/2022-Z-score was normal -Reviewed recent labs collected by another provider 10/28/2022: CMP, lipid, CBC, A1c and TSH normal.  Return in about 1 year (around 11/28/2024) for cpe (20 min).    Orders Placed This Encounter  Procedures   CBC   Comprehensive metabolic panel with GFR   TSH   Vitamin D  (25 hydroxy)   Meds ordered this encounter  Medications   atorvastatin  (LIPITOR) 10 MG tablet    Sig: Take 1 tablet (10 mg total) by mouth at bedtime. Take 1 tablet (10 mg total) by mouth daily 5 x/week.    Dispense:  90 tablet    Refill:  3   Referral Orders  No referral(s) requested today     Electronically signed by: Mcmahon Bellini, DO [facility] [place]

## 2023-11-28 NOTE — Patient Instructions (Addendum)

## 2023-11-29 ENCOUNTER — Encounter (HOSPITAL_BASED_OUTPATIENT_CLINIC_OR_DEPARTMENT_OTHER): Payer: Self-pay

## 2023-11-30 ENCOUNTER — Ambulatory Visit (HOSPITAL_BASED_OUTPATIENT_CLINIC_OR_DEPARTMENT_OTHER): Payer: Self-pay | Admitting: Cardiology

## 2024-02-29 ENCOUNTER — Encounter: Payer: Self-pay | Admitting: Nurse Practitioner

## 2024-02-29 ENCOUNTER — Ambulatory Visit: Admitting: Nurse Practitioner

## 2024-02-29 VITALS — BP 110/68

## 2024-02-29 DIAGNOSIS — N951 Menopausal and female climacteric states: Secondary | ICD-10-CM

## 2024-02-29 DIAGNOSIS — Z79890 Hormone replacement therapy (postmenopausal): Secondary | ICD-10-CM

## 2024-02-29 MED ORDER — ESTRADIOL 0.025 MG/24HR TD PTTW: 1.0000 | MEDICATED_PATCH | TRANSDERMAL | 1 refills | Status: DC

## 2024-02-29 MED ORDER — PROGESTERONE MICRONIZED 100 MG PO CAPS: 100.0000 mg | ORAL_CAPSULE | Freq: Every day | ORAL | 1 refills | Status: DC

## 2024-02-29 NOTE — Progress Notes (Signed)
   Acute Office Visit  Subjective:    Patient ID: Nichole Mcmahon, female    DOB: [birth year], 53 y.o.   MRN: [id number]   HPI 53 y.o. presents today for HRT consult. Complains of hot flashes, night sweats, sleep disturbance, fatigue, anxiety, low libido, painful intercourse, brain fog. Using Nuvaring continuously. Has occasional spotting.  No LMP recorded. (Menstrual status: Other). Period Pattern:  (irregular spotting every month) Menstrual Flow: Light Menstrual Control: Maxi pad Dysmenorrhea:  (pressure, no cramping)  Review of Systems  Constitutional:  Positive for fatigue.  Endocrine: Positive for heat intolerance.  Genitourinary:  Positive for dyspareunia.  Psychiatric/Behavioral:  Positive for agitation, decreased concentration and sleep disturbance.        Objective:    Physical Exam Constitutional:      Appearance: Normal appearance.     BP 110/68  Wt Readings from Last 3 Encounters:  11/28/23 116 lb 9.6 oz (52.9 kg)  07/07/23 118 lb 9.6 oz (53.8 kg)  04/27/23 116 lb (52.6 kg)        Assessment & Plan:   Problem List Items Addressed This Visit   None Visit Diagnoses       Perimenopausal symptoms    -  Primary   Relevant Medications   progesterone (PROMETRIUM) 100 MG capsule   estradiol  (VIVELLE -DOT) 0.025 MG/24HR     Hormone replacement therapy       Relevant Medications   progesterone (PROMETRIUM) 100 MG capsule   estradiol  (VIVELLE -DOT) 0.025 MG/24HR      Plan: Discussed perimenopause and what to expect. Stop Nuvaring and begin estradiol  patch twice weekly, Prometrium nightly. Aware that menses may return and HRT is not a contraception. Educated on proper use, benefits and risks. Discussed option to add vaginal estrogen if needed.   Return in about 4 weeks (around 03/28/2024) for Med follow up.    Annabella DELENA Shutter DNP, 8:58 AM 02/29/2024

## 2024-03-21 ENCOUNTER — Other Ambulatory Visit: Payer: Self-pay | Admitting: Nurse Practitioner

## 2024-03-21 DIAGNOSIS — N951 Menopausal and female climacteric states: Secondary | ICD-10-CM

## 2024-03-21 DIAGNOSIS — Z79890 Hormone replacement therapy (postmenopausal): Secondary | ICD-10-CM

## 2024-03-21 NOTE — Telephone Encounter (Signed)
 Med refill request: Vivelle   Last AEX: 04/27/23  Next AEX: 05/01/24 Last MMG (if hormonal med) Refill authorized:  last rx 02/29/24 #8 with 1 refill  Pharmacy comment: REQUEST FOR 90 DAYS PRESCRIPTION. DX Code Needed. Please advise

## 2024-03-23 ENCOUNTER — Other Ambulatory Visit: Payer: Self-pay | Admitting: Nurse Practitioner

## 2024-03-23 DIAGNOSIS — N951 Menopausal and female climacteric states: Secondary | ICD-10-CM

## 2024-03-23 DIAGNOSIS — Z79890 Hormone replacement therapy (postmenopausal): Secondary | ICD-10-CM

## 2024-03-25 ENCOUNTER — Encounter: Payer: Self-pay | Admitting: Nurse Practitioner

## 2024-03-25 ENCOUNTER — Other Ambulatory Visit: Payer: Self-pay | Admitting: Nurse Practitioner

## 2024-03-25 ENCOUNTER — Ambulatory Visit: Admitting: Nurse Practitioner

## 2024-03-25 VITALS — BP 110/68 | HR 83 | Wt 116.0 lb

## 2024-03-25 DIAGNOSIS — N951 Menopausal and female climacteric states: Secondary | ICD-10-CM

## 2024-03-25 DIAGNOSIS — N941 Unspecified dyspareunia: Secondary | ICD-10-CM

## 2024-03-25 DIAGNOSIS — N898 Other specified noninflammatory disorders of vagina: Secondary | ICD-10-CM

## 2024-03-25 DIAGNOSIS — Z79890 Hormone replacement therapy (postmenopausal): Secondary | ICD-10-CM | POA: Diagnosis not present

## 2024-03-25 MED ORDER — ESTRADIOL 0.01 % VA CREA: 1.0000 g | TOPICAL_CREAM | VAGINAL | 0 refills | Status: DC

## 2024-03-25 MED ORDER — ESTRADIOL 0.0375 MG/24HR TD PTTW: 1.0000 | MEDICATED_PATCH | TRANSDERMAL | 1 refills | Status: DC

## 2024-03-25 NOTE — Progress Notes (Signed)
   Acute Office Visit  Subjective:    Patient ID: Nichole Mcmahon, female    DOB: 1970-08-18, 53 y.o.   MRN: [id number]   HPI 53 y.o. presents today for med follow up. Started HRT 4 weeks ago for hot flashes, night sweats, sleep disturbance, fatigue, anxiety, low libido, painful intercourse, brain fog. Doing low dose estradiol  patch and nightly prometrium. Has noticed much improvement in all symptoms except hot flashes. Feels these have increased. Had menses after discontinuing Nuvaring and 2 episodes of light bleeding that lasted a couple of days.   Patient's last menstrual period was 03/19/2024 (exact date).    Review of Systems  Constitutional: Negative.   Endocrine: Positive for heat intolerance.       Objective:    Physical Exam Constitutional:      Appearance: Normal appearance.     BP 110/68   Pulse 83   Wt 116 lb (52.6 kg)   LMP 03/19/2024 (Exact Date)   SpO2 98%   BMI 20.55 kg/m  Wt Readings from Last 3 Encounters:  03/25/24 116 lb (52.6 kg)  11/28/23 116 lb 9.6 oz (52.9 kg)  07/07/23 118 lb 9.6 oz (53.8 kg)       Assessment & Plan:   Problem List Items Addressed This Visit   None Visit Diagnoses       Perimenopausal symptoms    -  Primary   Relevant Medications   estradiol  (VIVELLE -DOT) 0.0375 MG/24HR     Hormone replacement therapy       Relevant Medications   estradiol  (VIVELLE -DOT) 0.0375 MG/24HR      Plan: Will increase estradiol  for hot flashes. Continue nightly Prometrium. Discussed HRT and vaginal bleeding. Just coming off of Nuvaring, so bleeding may get better. Will monitor. Will follow up at annual next month.   Return if symptoms worsen or fail to improve.    Annabella DELENA Shutter DNP, 8:55 AM 03/25/2024

## 2024-04-30 NOTE — Progress Notes (Unsigned)
 Nichole Mcmahon 08/15/1970 [id number]  History:  53 y.o. G1P1 presents for annual exam. Started HRT in October for hot flashes, night sweats, sleep disturbance, fatigue, anxiety, low libido, painful intercourse, brain fog. Improvement in symptoms but has had some nausea and fatigue since increasing estrogen dose 4 weeks ago. Doing vaginal estrogen for dryness and painful intercourse. Normal pap and mammogram history. 2019 right femur fracture from fall.   Gynecologic History No LMP recorded. Patient is perimenopausal.   Contraception/Family planning: None Sexually active: Yes  Health maintenance Last Pap: 04/27/2023. Results were: Normal neg HPV Last mammogram: 08/11/2023. Results were: Normal Last colonoscopy: 03/20/2020. Results were: Normal, 10-year recall Last Dexa: 04/29/2022. Results were: T-score - 1.6     05/01/2024    7:35 AM  Depression screen PHQ 2/9  Decreased Interest 0  Down, Depressed, Hopeless 0  PHQ - 2 Score 0     Past medical history, past surgical history, family history and social history were all reviewed and documented in the EPIC chart. Married. 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 21 yo son , moved back to [place], works remote in [employer].   ROS:  A ROS was performed and pertinent positives and negatives are included.  Exam:  Vitals:   05/01/24 0731  BP: 100/62  Weight: 115 lb (52.2 kg)  Height: 5' 2.75 (1.594 m)       Body mass index is 20.53 kg/m.  General appearance:  Normal Thyroid :  Symmetrical, normal in size, without palpable masses or nodularity. Respiratory  Auscultation:  Clear without wheezing or rhonchi Cardiovascular  Auscultation:  Regular rate, without rubs, murmurs or gallops  Edema/varicosities:  Not grossly evident Abdominal  Soft,nontender, without masses, guarding or rebound.  Liver/spleen:  No organomegaly noted  Hernia:  None appreciated  Skin  Inspection:  Grossly normal   Breasts:  Examined lying and sitting.   Right: Without masses, retractions, discharge or axillary adenopathy.   Left: Without masses, retractions, discharge or axillary adenopathy. Pelvic: External genitalia:  no lesions              Urethra:  normal appearing urethra with no masses, tenderness or lesions              Bartholins and Skenes: normal                 Vagina: normal appearing vagina with normal color and discharge, no lesions              Cervix: no lesions Bimanual Exam:  Uterus:  no masses or tenderness              Adnexa: no mass, fullness, tenderness              Rectovaginal: Deferred              Anus:  normal, no lesions  Nichole Mcmahon, CMA present as chaperone.   Assessment/Plan:  53 y.o. G1P1 for annual exam.   Well female exam with routine gynecological exam - Education provided on SBEs, importance of preventative screenings, current guidelines, high calcium  diet, regular exercise, and multivitamin daily. Labs with PCP.   Depression screening - PHQ - 0  Vaginal dryness - Plan: estradiol  (ESTRACE ) 0.01 % CREA vaginal cream twice weekly.   Dyspareunia in female - Plan: estradiol  (ESTRACE ) 0.01 % CREA vaginal cream twice weekly.   Hormone replacement therapy - Plan: estradiol  (VIVELLE -DOT) 0.0375 MG/24HR twice weekly, progesterone  (PROMETRIUM ) 100 MG capsule  nightly. If nausea and fatigue continue will decrease dose of estrogen.   Osteopenia of neck of left femur - Plan: DG Bone Density. 04/2022 T-score -1.6. 2019 right femur fracture from fall. Taking daily vitamin D  supplement and doing regular resistance training. Mother had osteoporosis.  Cervical cancer screening - Normal pap history. Will repeat at 5-year interval per guidelines.   Screening for breast cancer - Normal mammogram history. Continue annual screenings. Normal breast exam today.   Screening for colon cancer - 03/2020 colonoscopy. Will repeat at GI's recommended interval.   Return in about 1 year (around  05/01/2025) for Annual.   Nichole Mcmahon [facility], 7:56 AM 05/01/2024

## 2024-05-01 ENCOUNTER — Ambulatory Visit: Admitting: Nurse Practitioner

## 2024-05-01 ENCOUNTER — Encounter: Payer: Self-pay | Admitting: Nurse Practitioner

## 2024-05-01 VITALS — BP 100/62 | Ht 62.75 in | Wt 115.0 lb

## 2024-05-01 DIAGNOSIS — N898 Other specified noninflammatory disorders of vagina: Secondary | ICD-10-CM | POA: Diagnosis not present

## 2024-05-01 DIAGNOSIS — N941 Unspecified dyspareunia: Secondary | ICD-10-CM | POA: Diagnosis not present

## 2024-05-01 DIAGNOSIS — Z01419 Encounter for gynecological examination (general) (routine) without abnormal findings: Secondary | ICD-10-CM | POA: Diagnosis not present

## 2024-05-01 DIAGNOSIS — Z1331 Encounter for screening for depression: Secondary | ICD-10-CM | POA: Diagnosis not present

## 2024-05-01 DIAGNOSIS — Z79890 Hormone replacement therapy (postmenopausal): Secondary | ICD-10-CM | POA: Diagnosis not present

## 2024-05-01 DIAGNOSIS — M85852 Other specified disorders of bone density and structure, left thigh: Secondary | ICD-10-CM

## 2024-05-01 MED ORDER — PROGESTERONE MICRONIZED 100 MG PO CAPS: 100.0000 mg | ORAL_CAPSULE | Freq: Every day | ORAL | 3 refills | Status: AC

## 2024-05-01 MED ORDER — ESTRADIOL 0.0375 MG/24HR TD PTTW: 1.0000 | MEDICATED_PATCH | TRANSDERMAL | 3 refills | Status: AC

## 2024-05-01 MED ORDER — ESTRADIOL 0.01 % VA CREA: 1.0000 g | TOPICAL_CREAM | VAGINAL | 5 refills | Status: AC

## 2024-05-12 ENCOUNTER — Encounter: Payer: Self-pay | Admitting: Nurse Practitioner

## 2024-05-13 ENCOUNTER — Other Ambulatory Visit: Payer: Self-pay | Admitting: Nurse Practitioner

## 2024-05-13 ENCOUNTER — Other Ambulatory Visit

## 2024-05-13 DIAGNOSIS — Z8639 Personal history of other endocrine, nutritional and metabolic disease: Secondary | ICD-10-CM

## 2024-05-13 NOTE — Telephone Encounter (Signed)
 05/01/24 AEX notes reviewed, if nausea continues will decrease dose of estrogen.

## 2024-05-14 ENCOUNTER — Ambulatory Visit: Payer: Self-pay | Admitting: Nurse Practitioner

## 2024-05-14 LAB — VITAMIN D 25 HYDROXY (VIT D DEFICIENCY, FRACTURES): Vit D, 25-Hydroxy: 86 ng/mL (ref 30–100)

## 2024-05-15 ENCOUNTER — Other Ambulatory Visit: Payer: Self-pay | Admitting: Nurse Practitioner

## 2024-05-15 DIAGNOSIS — R110 Nausea: Secondary | ICD-10-CM

## 2024-05-15 MED ORDER — ONDANSETRON HCL 4 MG PO TABS: 4.0000 mg | ORAL_TABLET | Freq: Three times a day (TID) | ORAL | 0 refills | Status: AC | PRN

## 2024-06-10 ENCOUNTER — Other Ambulatory Visit: Payer: Self-pay | Admitting: Nurse Practitioner

## 2024-06-10 DIAGNOSIS — Z79890 Hormone replacement therapy (postmenopausal): Secondary | ICD-10-CM

## 2024-06-11 NOTE — Telephone Encounter (Signed)
 Med refill request: estradiol  (vivelle -dot) 0.0375 mg patch Last AEX: 05/01/24 TW Next AEX: not yet scheduled Last MMG (if hormonal med) 08/11/23, scheduled 08/13/24 Refill authorized: Please Advise? Last Rx sent #12 with 3 refills on 05/02/24 TW

## 2024-07-04 ENCOUNTER — Ambulatory Visit (HOSPITAL_BASED_OUTPATIENT_CLINIC_OR_DEPARTMENT_OTHER): Admitting: Cardiology

## 2024-07-25 ENCOUNTER — Ambulatory Visit: Admitting: Dermatology

## 2024-08-13 ENCOUNTER — Encounter

## 2024-08-13 DIAGNOSIS — Z1231 Encounter for screening mammogram for malignant neoplasm of breast: Secondary | ICD-10-CM

## 2024-09-30 ENCOUNTER — Ambulatory Visit (HOSPITAL_BASED_OUTPATIENT_CLINIC_OR_DEPARTMENT_OTHER)

## 2024-12-03 ENCOUNTER — Encounter: Admitting: Family Medicine
